# Patient Record
Sex: Female | Born: 1975 | Race: White | Hispanic: No | Marital: Married | Smoking: Former smoker
Health system: Southern US, Community
[De-identification: ages and names within clinical notes are randomized; demographics above are authoritative.]

## PROBLEM LIST (undated history)

## (undated) DIAGNOSIS — F32A Depression, unspecified: Secondary | ICD-10-CM

## (undated) DIAGNOSIS — J189 Pneumonia, unspecified organism: Secondary | ICD-10-CM

## (undated) DIAGNOSIS — G43909 Migraine, unspecified, not intractable, without status migrainosus: Secondary | ICD-10-CM

## (undated) DIAGNOSIS — M19049 Primary osteoarthritis, unspecified hand: Secondary | ICD-10-CM

## (undated) DIAGNOSIS — S61052A Open bite of left thumb without damage to nail, initial encounter: Secondary | ICD-10-CM

## (undated) DIAGNOSIS — M797 Fibromyalgia: Secondary | ICD-10-CM

## (undated) DIAGNOSIS — F329 Major depressive disorder, single episode, unspecified: Secondary | ICD-10-CM

## (undated) DIAGNOSIS — N200 Calculus of kidney: Secondary | ICD-10-CM

## (undated) DIAGNOSIS — K219 Gastro-esophageal reflux disease without esophagitis: Secondary | ICD-10-CM

## (undated) DIAGNOSIS — Z8489 Family history of other specified conditions: Secondary | ICD-10-CM

## (undated) DIAGNOSIS — W540XXA Bitten by dog, initial encounter: Secondary | ICD-10-CM

---
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
## 2013-02-11 ENCOUNTER — Encounter (HOSPITAL_COMMUNITY): Payer: Self-pay | Admitting: Certified Registered Nurse Anesthetist

## 2013-02-11 ENCOUNTER — Encounter (HOSPITAL_COMMUNITY): Payer: Self-pay | Admitting: Internal Medicine

## 2013-02-11 ENCOUNTER — Inpatient Hospital Stay (HOSPITAL_COMMUNITY)
Admission: AD | Admit: 2013-02-11 | Discharge: 2013-02-12 | DRG: 572 | Disposition: A | Discharge status: Home / Self Care | Payer: Worker's Compensation | Source: Other Acute Inpatient Hospital | Attending: Orthopedic Surgery | Admitting: Orthopedic Surgery

## 2013-02-11 ENCOUNTER — Inpatient Hospital Stay (HOSPITAL_COMMUNITY): Payer: Worker's Compensation | Admitting: Certified Registered Nurse Anesthetist

## 2013-02-11 ENCOUNTER — Encounter (HOSPITAL_COMMUNITY)
Admission: AD | Disposition: A | Discharge status: Home / Self Care | Payer: Self-pay | Source: Other Acute Inpatient Hospital | Attending: Internal Medicine

## 2013-02-11 DIAGNOSIS — IMO0001 Reserved for inherently not codable concepts without codable children: Secondary | ICD-10-CM | POA: Diagnosis present

## 2013-02-11 DIAGNOSIS — K219 Gastro-esophageal reflux disease without esophagitis: Secondary | ICD-10-CM | POA: Diagnosis present

## 2013-02-11 DIAGNOSIS — S61209A Unspecified open wound of unspecified finger without damage to nail, initial encounter: Secondary | ICD-10-CM | POA: Diagnosis present

## 2013-02-11 DIAGNOSIS — Y990 Civilian activity done for income or pay: Secondary | ICD-10-CM

## 2013-02-11 DIAGNOSIS — M797 Fibromyalgia: Secondary | ICD-10-CM | POA: Diagnosis present

## 2013-02-11 DIAGNOSIS — L03019 Cellulitis of unspecified finger: Principal | ICD-10-CM | POA: Diagnosis present

## 2013-02-11 DIAGNOSIS — L0390 Cellulitis, unspecified: Secondary | ICD-10-CM | POA: Diagnosis present

## 2013-02-11 DIAGNOSIS — Z79899 Other long term (current) drug therapy: Secondary | ICD-10-CM

## 2013-02-11 DIAGNOSIS — F3289 Other specified depressive episodes: Secondary | ICD-10-CM | POA: Diagnosis present

## 2013-02-11 DIAGNOSIS — F329 Major depressive disorder, single episode, unspecified: Secondary | ICD-10-CM | POA: Diagnosis present

## 2013-02-11 DIAGNOSIS — L02519 Cutaneous abscess of unspecified hand: Principal | ICD-10-CM | POA: Diagnosis present

## 2013-02-11 DIAGNOSIS — L0291 Cutaneous abscess, unspecified: Secondary | ICD-10-CM

## 2013-02-11 DIAGNOSIS — W540XXA Bitten by dog, initial encounter: Secondary | ICD-10-CM | POA: Diagnosis present

## 2013-02-11 HISTORY — PX: INCISION AND DRAINAGE OF WOUND: SHX1803

## 2013-02-11 HISTORY — DX: Family history of other specified conditions: Z84.89

## 2013-02-11 HISTORY — DX: Depression, unspecified: F32.A

## 2013-02-11 HISTORY — DX: Bitten by dog, initial encounter: W54.0XXA

## 2013-02-11 HISTORY — DX: Gastro-esophageal reflux disease without esophagitis: K21.9

## 2013-02-11 HISTORY — DX: Primary osteoarthritis, unspecified hand: M19.049

## 2013-02-11 HISTORY — PX: I & D EXTREMITY: SHX5045

## 2013-02-11 HISTORY — PX: FINGER NAIL SURGERY: SHX717

## 2013-02-11 HISTORY — DX: Pneumonia, unspecified organism: J18.9

## 2013-02-11 HISTORY — DX: Major depressive disorder, single episode, unspecified: F32.9

## 2013-02-11 HISTORY — DX: Open bite of left thumb without damage to nail, initial encounter: S61.052A

## 2013-02-11 HISTORY — DX: Fibromyalgia: M79.7

## 2013-02-11 HISTORY — DX: Migraine, unspecified, not intractable, without status migrainosus: G43.909

## 2013-02-11 HISTORY — DX: Calculus of kidney: N20.0

## 2013-02-11 SURGERY — IRRIGATION AND DEBRIDEMENT EXTREMITY
Anesthesia: General | Site: Thumb | Laterality: Left | Wound class: Dirty or Infected

## 2013-02-11 MED ORDER — CYCLOBENZAPRINE HCL 10 MG PO TABS
10.0000 mg | ORAL_TABLET | Freq: Every day | ORAL | Status: DC
  Filled 2013-02-11: qty 1

## 2013-02-11 MED ORDER — BUPROPION HCL ER (SR) 150 MG PO TB12
450.0000 mg | ORAL_TABLET | Freq: Every morning | ORAL | Status: DC
Start: 2013-02-12 — End: 2013-02-12
  Administered 2013-02-12: 450 mg via ORAL
  Filled 2013-02-11: qty 3

## 2013-02-11 MED ORDER — BUPIVACAINE-EPINEPHRINE 0.5% -1:200000 IJ SOLN
INTRAMUSCULAR | Status: DC | PRN
  Administered 2013-02-11: 5 mL

## 2013-02-11 MED ORDER — ONDANSETRON HCL 4 MG/2ML IJ SOLN: 4.0000 mg | Freq: Four times a day (QID) | INTRAMUSCULAR | Status: DC | PRN

## 2013-02-11 MED ORDER — PROMETHAZINE HCL 25 MG/ML IJ SOLN: 6.2500 mg | INTRAMUSCULAR | Status: DC | PRN

## 2013-02-11 MED ORDER — ONDANSETRON HCL 4 MG/2ML IJ SOLN
INTRAMUSCULAR | Status: DC | PRN
  Administered 2013-02-11: 4 mg via INTRAVENOUS

## 2013-02-11 MED ORDER — SODIUM CHLORIDE 0.9 % IR SOLN
Status: DC | PRN
  Administered 2013-02-11: 1

## 2013-02-11 MED ORDER — HYDROMORPHONE HCL PF 1 MG/ML IJ SOLN: 0.2500 mg | INTRAMUSCULAR | Status: DC | PRN

## 2013-02-11 MED ORDER — PANTOPRAZOLE SODIUM 40 MG PO TBEC
40.0000 mg | DELAYED_RELEASE_TABLET | Freq: Every day | ORAL | Status: DC
  Administered 2013-02-11 – 2013-02-12 (×2): 40 mg via ORAL
  Filled 2013-02-11 (×2): qty 1

## 2013-02-11 MED ORDER — SODIUM CHLORIDE 0.9 % IV SOLN
INTRAVENOUS | Status: DC
  Administered 2013-02-11: 20:00:00 via INTRAVENOUS

## 2013-02-11 MED ORDER — OXYCODONE HCL 5 MG/5ML PO SOLN: 5.0000 mg | Freq: Once | ORAL | Status: AC | PRN

## 2013-02-11 MED ORDER — SUCCINYLCHOLINE CHLORIDE 20 MG/ML IJ SOLN
INTRAMUSCULAR | Status: DC | PRN
  Administered 2013-02-11: 100 mg via INTRAVENOUS

## 2013-02-11 MED ORDER — VITAMIN C 500 MG PO TABS
1000.0000 mg | ORAL_TABLET | Freq: Two times a day (BID) | ORAL | Status: DC
  Administered 2013-02-12: 1000 mg via ORAL
  Filled 2013-02-11 (×2): qty 2

## 2013-02-11 MED ORDER — NAPROXEN 500 MG PO TABS
500.0000 mg | ORAL_TABLET | Freq: Two times a day (BID) | ORAL | Status: DC
  Administered 2013-02-12: 500 mg via ORAL
  Filled 2013-02-11 (×4): qty 1

## 2013-02-11 MED ORDER — SODIUM CHLORIDE 0.9 % IV SOLN
3.0000 g | Freq: Once | INTRAVENOUS | Status: AC
  Administered 2013-02-12: 3 g via INTRAVENOUS
  Filled 2013-02-11: qty 3

## 2013-02-11 MED ORDER — SODIUM CHLORIDE 0.9 % IV SOLN
INTRAVENOUS | Status: DC
  Administered 2013-02-12: 20 mL/h via INTRAVENOUS

## 2013-02-11 MED ORDER — PROPOFOL 10 MG/ML IV BOLUS
INTRAVENOUS | Status: DC | PRN
  Administered 2013-02-11: 200 mg via INTRAVENOUS

## 2013-02-11 MED ORDER — MONTELUKAST SODIUM 10 MG PO TABS
10.0000 mg | ORAL_TABLET | Freq: Every day | ORAL | Status: DC
  Filled 2013-02-11: qty 1

## 2013-02-11 MED ORDER — METHOCARBAMOL 500 MG PO TABS
500.0000 mg | ORAL_TABLET | Freq: Three times a day (TID) | ORAL | Status: DC | PRN
  Administered 2013-02-11 – 2013-02-12 (×2): 500 mg via ORAL
  Filled 2013-02-11 (×2): qty 1

## 2013-02-11 MED ORDER — SODIUM CHLORIDE 0.9 % IV SOLN
3.0000 g | Freq: Three times a day (TID) | INTRAVENOUS | Status: DC
  Administered 2013-02-12 (×2): 3 g via INTRAVENOUS
  Filled 2013-02-11 (×4): qty 3

## 2013-02-11 MED ORDER — SODIUM CHLORIDE 0.9 % IV SOLN
INTRAVENOUS | Status: DC | PRN
  Administered 2013-02-11: 21:00:00 via INTRAVENOUS

## 2013-02-11 MED ORDER — SERTRALINE HCL 100 MG PO TABS
200.0000 mg | ORAL_TABLET | Freq: Every evening | ORAL | Status: DC
  Administered 2013-02-11: 200 mg via ORAL
  Filled 2013-02-11: qty 2
  Filled 2013-02-11: qty 1

## 2013-02-11 MED ORDER — ACETAMINOPHEN 325 MG PO TABS: 650.0000 mg | ORAL_TABLET | Freq: Four times a day (QID) | ORAL | Status: DC | PRN

## 2013-02-11 MED ORDER — HYDROMORPHONE HCL PF 1 MG/ML IJ SOLN
1.0000 mg | INTRAMUSCULAR | Status: DC | PRN
  Administered 2013-02-11 – 2013-02-12 (×3): 1 mg via INTRAVENOUS
  Filled 2013-02-11 (×4): qty 1

## 2013-02-11 MED ORDER — OXYCODONE HCL 5 MG PO TABS: 5.0000 mg | ORAL_TABLET | Freq: Once | ORAL | Status: AC | PRN

## 2013-02-11 MED ORDER — BUPIVACAINE-EPINEPHRINE (PF) 0.5% -1:200000 IJ SOLN
INTRAMUSCULAR | Status: AC
  Filled 2013-02-11: qty 10

## 2013-02-11 MED ORDER — LORATADINE 10 MG PO TABS: 10.0000 mg | ORAL_TABLET | Freq: Every day | ORAL | Status: DC

## 2013-02-11 MED ORDER — FENTANYL CITRATE 0.05 MG/ML IJ SOLN
INTRAMUSCULAR | Status: DC | PRN
  Administered 2013-02-11 (×3): 50 ug via INTRAVENOUS

## 2013-02-11 SURGICAL SUPPLY — 28 items
BANDAGE CONFORM 2  STR LF (GAUZE/BANDAGES/DRESSINGS) ×2 IMPLANT
BNDG COHESIVE 1X5 TAN STRL LF (GAUZE/BANDAGES/DRESSINGS) ×2 IMPLANT
CHLORAPREP W/TINT 26ML (MISCELLANEOUS) ×2 IMPLANT
CLOTH BEACON ORANGE TIMEOUT ST (SAFETY) ×2 IMPLANT
COVER SURGICAL LIGHT HANDLE (MISCELLANEOUS) ×2 IMPLANT
CUFF TOURNIQUET SINGLE 18IN (TOURNIQUET CUFF) ×2 IMPLANT
DRAPE SURG 17X23 STRL (DRAPES) ×2 IMPLANT
GAUZE XEROFORM 1X8 LF (GAUZE/BANDAGES/DRESSINGS) ×2 IMPLANT
GLOVE BIO SURGEON STRL SZ7.5 (GLOVE) ×2 IMPLANT
GLOVE BIOGEL PI IND STRL 8 (GLOVE) ×1 IMPLANT
GLOVE BIOGEL PI INDICATOR 8 (GLOVE) ×1
GOWN PREVENTION PLUS XXLARGE (GOWN DISPOSABLE) ×2 IMPLANT
GOWN STRL NON-REIN LRG LVL3 (GOWN DISPOSABLE) ×2 IMPLANT
KIT BASIN OR (CUSTOM PROCEDURE TRAY) ×2 IMPLANT
KIT ROOM TURNOVER OR (KITS) ×2 IMPLANT
NEEDLE HYPO 25GX1X1/2 BEV (NEEDLE) ×2 IMPLANT
NS IRRIG 1000ML POUR BTL (IV SOLUTION) ×2 IMPLANT
PACK ORTHO EXTREMITY (CUSTOM PROCEDURE TRAY) ×2 IMPLANT
PAD ARMBOARD 7.5X6 YLW CONV (MISCELLANEOUS) ×4 IMPLANT
SPONGE GAUZE 4X4 12PLY (GAUZE/BANDAGES/DRESSINGS) ×2 IMPLANT
SWAB COLLECTION DEVICE MRSA (MISCELLANEOUS) ×2 IMPLANT
SYR BULB IRRIGATION 50ML (SYRINGE) ×2 IMPLANT
SYR CONTROL 10ML LL (SYRINGE) ×2 IMPLANT
TOWEL OR 17X26 10 PK STRL BLUE (TOWEL DISPOSABLE) ×2 IMPLANT
TUBE ANAEROBIC SPECIMEN COL (MISCELLANEOUS) IMPLANT
TUBE CONNECTING 12X1/4 (SUCTIONS) ×2 IMPLANT
UNDERPAD 30X30 INCONTINENT (UNDERPADS AND DIAPERS) ×2 IMPLANT
YANKAUER SUCT BULB TIP NO VENT (SUCTIONS) ×2 IMPLANT

## 2013-02-11 NOTE — Transfer of Care (Signed)
Immediate Anesthesia Transfer of Care Note  Patient: Jamie Yang  Procedure(s) Performed: Procedure(s): IRRIGATION AND DEBRIDEMENTLeft thumb with nail removal. (Left)  Patient Location: PACU  Anesthesia Type:General  Level of Consciousness: awake, alert  and oriented  Airway & Oxygen Therapy: Patient Spontanous Breathing and Patient connected to nasal cannula oxygen  Post-op Assessment: Report given to PACU RN and Post -op Vital signs reviewed and stable  Post vital signs: Reviewed and stable  Complications: No apparent anesthesia complications

## 2013-02-11 NOTE — Preoperative (Signed)
Beta Blockers   Reason not to administer Beta Blockers:Not Applicable 

## 2013-02-11 NOTE — Op Note (Addendum)
02/11/2013  10:12 PM  PATIENT:  Jamie Yang  37 y.o. female  PRE-OPERATIVE DIAGNOSIS:  left thumb dog bite abscess  POST-OPERATIVE DIAGNOSIS:  Same--confirmed felon & parionychium  PROCEDURE:  Removal of left thumb nail plate, incision and excisional debridement of left thumb skin & subcutaneous tissue  SURGEON: Cliffton Asters. Janee Morn, MD  PHYSICIAN ASSISTANT: None  ANESTHESIA:  general  SPECIMENS:  Swabs to microbiology  DRAINS:   None  PREOPERATIVE INDICATIONS:  Jamie Yang is a  37 y.o. 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    The risks benefits and alternatives were discussed with the patient preoperatively including but not limited to the risks of infection, bleeding, nerve injury, cardiopulmonary complications, the need for revision surgery, among others, and the patient verbalized understanding and consented to proceed.  OPERATIVE IMPLANTS: None  OPERATIVE FINDINGS: Pus in the pulp, wounds that communicated from dorsal to volar. There appeared to be no proximal spread of the gross purulence.  OPERATIVE PROCEDURE:  The patient was escorted to the operative theatre and placed in a supine position.  General anesthesia was administered A surgical "time-out" was performed during which the planned procedure, proposed operative site, and the correct patient identity were compared to the operative consent and agreement confirmed by the circulating nurse according to current facility policy.  Following application of a tourniquet to the operative extremity, the exposed skin was prepped with Chloraprep and draped in the usual sterile fashion.  The limb was exsanguinated with an Esmarch bandage and the tourniquet inflated to approximately higher than systolic BP.  The nail plate was separated from the nail bed using a freer elevator and the nail plate was removed. On the  radial aspect of the nail fold there was a perforation that went deep. On the palmar side the areas of blackened skin were completely excised and the interval between them connected with an incision. The major wound had pus emanating forth which was captured for culture. Many of the septae both radial and ulnar to the longitudinal split were disrupted with spreading dissection. The wounds were copiously irrigated and irrigant to be entered dorsally exiting volarly. There appeared to be no proximal spread of the process. Tourniquet was released and a digital block performed with half percent Marcaine with epinephrine. Hemostasis was obtained with direct pressure, and Xeroform was placed on the nailbed & tucked up into the nail fold. Additionally Xeroform was placed into the palmar wound and repacking and this was left open. A dressing was applied and she was awakened and taken to recovery in stable condition  DISPOSITION: She will return the floor for continued antibiotic management per the hospitalist service and monitoring for improvement sufficient to convert to oral antibiotics and be discharged from hospital.

## 2013-02-11 NOTE — H&P (Signed)
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   Review of Systems: The patient denies anorexia, fever, weight loss,, vision loss, decreased hearing, hoarseness, chest pain, syncope, dyspnea on exertion, peripheral edema, balance deficits, hemoptysis, abdominal pain, melena, hematochezia, severe indigestion/heartburn, hematuria, incontinence, genital sores, muscle weakness, suspicious skin lesions, transient blindness, difficulty walking, depression, unusual weight change, abnormal bleeding, enlarged lymph nodes, angioedema, and breast masses.    Past medical history GERD Fibromyalgia Depression . No past surgical history on file.  Social History:  has no tobacco, alcohol, and drug history on file. 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 Authorizing Provider  acetaminophen (TYLENOL) 500 MG tablet Take 1,000 mg by mouth every 6 (six) hours as needed for pain.   Yes Historical Provider, MD  amoxicillin-clavulanate (AUGMENTIN) 875-125 MG per tablet Take 1 tablet by mouth 2 (two) times daily.   Yes Historical Provider, MD  Ascorbic Acid (VITAMIN C) 1000 MG tablet Take  1,000 mg by mouth 2 (two) times daily.   Yes Historical Provider, MD  buPROPion (WELLBUTRIN SR) 150 MG 12 hr tablet Take 450 mg by mouth every morning.   Yes Historical Provider, MD  butalbital-aspirin-caffeine [facility]) 50-325-40 MG per capsule Take 1-2 capsules by mouth every 6 (six) hours as needed for headache or migraine.   Yes Historical Provider, MD  calcium-vitamin D (OSCAL WITH D) 500-200 MG-UNIT per tablet Take 1 tablet by mouth 2 (two) times daily.   Yes Historical Provider, MD  cetirizine (ZYRTEC) 10 MG tablet Take 10 mg by mouth 2 (two) times daily.   Yes Historical Provider, MD  Cholecalciferol (VITAMIN D3) 5000 UNITS TABS Take 5,000 Units by mouth every morning.   Yes Historical Provider, MD  cyclobenzaprine (FLEXERIL) 10 MG tablet Take 10 mg by mouth at bedtime.   Yes Historical Provider, MD  DHA-EPA-VIT B6-B12-FOLIC ACID PO Take 1 tablet by mouth every morning.   Yes Historical Provider, MD  esomeprazole (NEXIUM) 40 MG capsule Take 40 mg by mouth every evening.   Yes Historical Provider, MD  fexofenadine (ALLEGRA) 180 MG tablet Take 180 mg by mouth every morning.   Yes Historical Provider, MD  fish oil-omega-3 fatty acids 1000 MG capsule Take 2 g by mouth 2 (two) times daily.   Yes Historical Provider, MD  ibuprofen (ADVIL,MOTRIN) 200 MG tablet Take 800 mg by mouth every 6 (six) hours as needed for pain.   Yes Historical Provider, MD  Melatonin 3 MG TABS Take 6 mg by mouth every evening.   Yes Historical Provider, MD  methocarbamol (ROBAXIN) 500 MG tablet Take 500 mg by mouth 3 (three) times daily as needed (spasms).   Yes Historical Provider,  MD  montelukast (SINGULAIR) 10 MG tablet Take 10 mg by mouth at bedtime.   Yes Historical Provider, MD  omeprazole (PRILOSEC) 20 MG capsule Take 20 mg by mouth daily.   Yes Historical Provider, MD  oxyCODONE-acetaminophen (PERCOCET) 10-325 MG per tablet Take 1 tablet by mouth every 4 (four) hours as needed for pain.   Yes Historical Provider,  MD  Prenatal Vit-Fe Fumarate-FA (PRENATAL MULTIVITAMIN) TABS Take 1 tablet by mouth every evening.   Yes Historical Provider, MD  Probiotic Product (ALIGN) 4 MG CAPS Take 1 capsule by mouth every evening.   Yes Historical Provider, MD  sertraline (ZOLOFT) 100 MG tablet Take 200 mg by mouth every evening.   Yes Historical Provider, MD  SUMAtriptan-naproxen (TREXIMET) 85-500 MG per tablet Take 1 tablet by mouth every 2 (two) hours as needed for migraine (no more than 2 tablets in a 24 hour period).   Yes Historical Provider, MD   Physical Exam: Filed Vitals:   02/11/13 1842  BP: 130/77  Pulse: 90  Temp: 98.7 F (37.1 C)  TempSrc: Oral  Resp: 15  SpO2: 100%     General:  AAOx3, no distress  HEENT: PERRLA, EOMI  Cardiovascular: S1S2/RRR  Respiratory: CTAB  Abdomen: soft, Nt, BS present  Skin: no rashes  Musculoskeletal:RUE with swelling, redness, tenderness and limited range of movement of thumb, 2 puncture wounds on volar aspect of thumb  Psychiatric: appropriate mood and affect  Neurologic: non focal  Labs on Admission:  Basic Metabolic Panel: Bmet at [facility] nomal CBC with WBC of 13K and rest normal  Liver Function Tests: No results found for this basename: AST, ALT, ALKPHOS, BILITOT, PROT, ALBUMIN,  in the last 168 hours No results found for this basename: LIPASE, AMYLASE,  in the last 168 hours No results found for this basename: AMMONIA,  in the last 168 hours CBC: No results found for this basename: WBC, NEUTROABS, HGB, HCT, MCV, PLT,  in the last 168 hours Cardiac Enzymes: No results found for this basename: CKTOTAL, CKMB, CKMBINDEX, TROPONINI,  in the last 168 hours  BNP (last 3 results) No results found for this basename: PROBNP,  in the last 8760 hours CBG: No results found for this basename: GLUCAP,  in the last 168 hours  Radiological Exams on Admission: No results found. Assessment/Plan Active Problems:   Cellulitis   Fibromyalgia   GERD  (gastroesophageal reflux disease)   1. Cellulitis of Left Thumb, concern for Flexor Tendinitis -start IV Unasyn -Hand Surgery Dr.Thompson consulted -IVF -Keep Finger elevated  2. GERD -PPI  3. Depression -continue wellbutrin and SSRIs  4. Fibromyagia: -continue home meds  DVT proph: SCDs  Code Status: FULL Family Communication: none at bedside Disposition Plan: inpatient  Time spent:  [facility] Pager [phone]  If 7PM-7AM, please contact night-coverage [email or web address] Password [email or web address] 02/11/2013, 8:15 PM

## 2013-02-11 NOTE — Progress Notes (Signed)
ANTIBIOTIC CONSULT NOTE - INITIAL  Pharmacy Consult for Unasyn Indication: Dog bite on thumb  No Known Allergies  Patient Measurements: Height: 5\' 3"  (160 cm) Weight: 217 lb (98.431 kg) IBW/kg (Calculated) : 52.4 Adjusted Body Weight:   Vital Signs: Temp: 99.2 F (37.3 C) (06/09 2200) Temp src: Oral (06/09 2042) BP: 137/82 mmHg (06/09 2215) Pulse Rate: 93 (06/09 2215) Intake/Output from previous day:   Intake/Output from this shift: Total I/O In: 600 [I.V.:600] Out: 5 [Blood:5]  Labs: No results found for this basename: WBC, HGB, PLT, LABCREA, CREATININE,  in the last 72 hours CrCl is unknown because no creatinine reading has been taken. No results found for this basename: VANCOTROUGH, VANCOPEAK, VANCORANDOM, GENTTROUGH, GENTPEAK, GENTRANDOM, TOBRATROUGH, TOBRAPEAK, TOBRARND, AMIKACINPEAK, AMIKACINTROU, AMIKACIN,  in the last 72 hours   Microbiology: No results found for this or any previous visit (from the past 720 hour(s)).  Medical History: Past Medical History  Diagnosis Date  . GERD (gastroesophageal reflux disease)   . Fibromyalgia   . Depression     Medications:  Scheduled:  . [MAR HOLD] ampicillin-sulbactam (UNASYN) IV  3 g Intravenous Once  . [START ON 02/12/2013] ampicillin-sulbactam (UNASYN) IV  3 g Intravenous Q8H  . [facility] HOLD] buPROPion  450 mg Oral q morning - 10a  . [facility] HOLD] cyclobenzaprine  10 mg Oral QHS  . [START ON 02/12/2013] loratadine  10 mg Oral Daily  . [MAR HOLD] montelukast  10 mg Oral QHS  . [MAR HOLD] pantoprazole  40 mg Oral Q1200  . 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 Her PCP started her on Augmentin but the wound got progressively swollen, red and tender. She was referred from [facility] ED to [facility] for surgery and treatment.  Goal of Therapy:  Resolution of infection in thumb.  Plan:  Unasyn 3 Gm IV q8h.  Eugene Garnet 02/11/2013,10:26 PM

## 2013-02-11 NOTE — Anesthesia Procedure Notes (Signed)
Procedure Name: Intubation Date/Time: 02/11/2013 9:23 PM Performed by: Julianne Rice Z Pre-anesthesia Checklist: Patient identified, Timeout performed, Emergency Drugs available, Suction available and Patient being monitored Patient Re-evaluated:Patient Re-evaluated prior to inductionOxygen Delivery Method: Circle system utilized Preoxygenation: Pre-oxygenation with 100% oxygen Intubation Type: IV induction, Cricoid Pressure applied and Rapid sequence Laryngoscope Size: Mac and 3 Grade View: Grade I Tube type: Oral Tube size: 7.5 mm Number of attempts: 1 Airway Equipment and Method: Stylet and LTA kit utilized Placement Confirmation: ETT inserted through vocal cords under direct vision and breath sounds checked- equal and bilateral Secured at: 22 cm Tube secured with: Tape Dental Injury: Teeth and Oropharynx as per pre-operative assessment

## 2013-02-11 NOTE — Consult Note (Signed)
ORTHOPAEDIC CONSULTATION  REQUESTING PHYSICIAN: Zannie Cove, MD  Chief Complaint: Left thumb infection  HPI: Jamie Yang is a 37 y.o. female who complains of  pain and swelling the left thumb. The patient works at a veterinarian's office. She reports that on Friday she was bitten by a dog on the left thumb. On the canines penetrated on the dorsal aspect of the thumb adjacent to the nail radially, another penetrated volarly into the pulp and then incisor also penetrated into the pulp. He became much more swollen and painful overnight and on Saturday she was placed on Augmentin at an urgent care. This failed to significantly improve and she was evaluated by her primary physician today who recommended that she go to the emergency room at the hospital in [place].  She was also given a dose of Rocephin at that time. By the patient's report, she was evaluated by an orthopedic surgeon at the hospital who relayed his concern about the possibility that this was a developing flexor sheath infection.  The patient reports to me that she was told that she would likely need surgery, but that that surgeon was presently too busy. She was transferred for IV antibiotics and further evaluation to [facility].  Past Medical History  Diagnosis Date  . GERD (gastroesophageal reflux disease)   . Fibromyalgia   . Depression    No past surgical history on file. History   Social History  . Marital Status: Married    Spouse Name: N/A    Number of Children: N/A  . Years of Education: N/A   Social History Main Topics  . Smoking status: Not on file  . Smokeless tobacco: Not on file  . Alcohol Use: Not on file  . Drug Use: Not on file  . Sexually Active: Not on file   Other Topics Concern  . Not on file   Social History Narrative  . No narrative on file   No family history on file. No Known Allergies Prior to Admission medications   Medication Sig Start Date End Date Taking? Authorizing Provider   acetaminophen (TYLENOL) 500 MG tablet Take 1,000 mg by mouth every 6 (six) hours as needed for pain.   Yes Historical Provider, MD  amoxicillin-clavulanate (AUGMENTIN) 875-125 MG per tablet Take 1 tablet by mouth 2 (two) times daily.   Yes Historical Provider, MD  Ascorbic Acid (VITAMIN C) 1000 MG tablet Take 1,000 mg by mouth 2 (two) times daily.   Yes Historical Provider, MD  buPROPion (WELLBUTRIN SR) 150 MG 12 hr tablet Take 450 mg by mouth every morning.   Yes Historical Provider, MD  butalbital-aspirin-caffeine [facility]) 50-325-40 MG per capsule Take 1-2 capsules by mouth every 6 (six) hours as needed for headache or migraine.   Yes Historical Provider, MD  calcium-vitamin D (OSCAL WITH D) 500-200 MG-UNIT per tablet Take 1 tablet by mouth 2 (two) times daily.   Yes Historical Provider, MD  cetirizine (ZYRTEC) 10 MG tablet Take 10 mg by mouth 2 (two) times daily.   Yes Historical Provider, MD  Cholecalciferol (VITAMIN D3) 5000 UNITS TABS Take 5,000 Units by mouth every morning.   Yes Historical Provider, MD  cyclobenzaprine (FLEXERIL) 10 MG tablet Take 10 mg by mouth at bedtime.   Yes Historical Provider, MD  DHA-EPA-VIT B6-B12-FOLIC ACID PO Take 1 tablet by mouth every morning.   Yes Historical Provider, MD  esomeprazole (NEXIUM) 40 MG capsule Take 40 mg by mouth every evening.   Yes Historical Provider, MD  fexofenadine (ALLEGRA) 180 MG tablet Take 180 mg by mouth every morning.   Yes Historical Provider, MD  fish oil-omega-3 fatty acids 1000 MG capsule Take 2 g by mouth 2 (two) times daily.   Yes Historical Provider, MD  ibuprofen (ADVIL,MOTRIN) 200 MG tablet Take 800 mg by mouth every 6 (six) hours as needed for pain.   Yes Historical Provider, MD  Melatonin 3 MG TABS Take 6 mg by mouth every evening.   Yes Historical Provider, MD  methocarbamol (ROBAXIN) 500 MG tablet Take 500 mg by mouth 3 (three) times daily as needed (spasms).   Yes Historical Provider, MD  montelukast (SINGULAIR) 10  MG tablet Take 10 mg by mouth at bedtime.   Yes Historical Provider, MD  omeprazole (PRILOSEC) 20 MG capsule Take 20 mg by mouth daily.   Yes Historical Provider, MD  oxyCODONE-acetaminophen (PERCOCET) 10-325 MG per tablet Take 1 tablet by mouth every 4 (four) hours as needed for pain.   Yes Historical Provider, MD  Prenatal Vit-Fe Fumarate-FA (PRENATAL MULTIVITAMIN) TABS Take 1 tablet by mouth every evening.   Yes Historical Provider, MD  Probiotic Product (ALIGN) 4 MG CAPS Take 1 capsule by mouth every evening.   Yes Historical Provider, MD  sertraline (ZOLOFT) 100 MG tablet Take 200 mg by mouth every evening.   Yes Historical Provider, MD  SUMAtriptan-naproxen (TREXIMET) 85-500 MG per tablet Take 1 tablet by mouth every 2 (two) hours as needed for migraine (no more than 2 tablets in a 24 hour period).   Yes Historical Provider, MD   No results found.  Positive ROS: All other systems have been reviewed and were otherwise negative with the exception of those mentioned in the HPI and as above.  Physical Exam: Vitals: Refer to EMR. Constitutional:  WD, WN, NAD HEENT:  NCAT, EOMI Neuro/Psych:  Alert & oriented to person, place, and time; appropriate mood & affect Lymphatic: No generalized UE edema or lymphadenopathy Extremities / MSK:  The extremities are normal with respect to appearance, ranges of motion, joint stability, muscle strength/tone, sensation, & perfusion except as otherwise noted:   The left thumb is swollen, but this is largely confined to the IP crease and distal to it. The pulp is fairly tight, and the areas where the 2 teeth penetrated the pulp are dark with a very light ring around it as if there is some intradermal pus.  On the dorsal side adjacent to the radial aspect of the midportion of the nail there is also a wound and there is a drop of serous drainage from it. Further at the base of the nail fold there appears to be a pocket of pus that has previously drained and is not  presently enlarged. She can extend the thumb to neutral without significant pain, she can extend all the other digits into hyperextension without significant pain there is minimal pain with palpation along the flexor tendon sheath proximal to the IP joint. There appears to be no significant increased pain with flexion and extension of the IP joint itself.  Assessment: Left thumb felon and paronychium  Plan: I discussed these findings with the patient and recommended a small incision and drainage, with debridement as necessary. If she were still in the emergency room, perhaps this could be a bedside procedure under digital block. However due to the hand stability for irrigation and writing and visualization, we will proceed to the operating room for removal of the nail, exploration and incision and drainage as necessary. Goals,  risks and options were reviewed and the patient consented to proceed. She will likely remain hospitalized for additional IV antibiotics and require conversion to oral antibiotics with outpatient medical monitoring for resolution.  Cliffton Asters Janee Morn, MD     Mobile [phone] Orthopaedic & Hand Surgery [facility] [address] [place], [place]  [address] [phone]

## 2013-02-11 NOTE — Anesthesia Postprocedure Evaluation (Signed)
Anesthesia Post Note  Patient: Jamie Yang  Procedure(s) Performed: Procedure(s) (LRB): IRRIGATION AND DEBRIDEMENTLeft thumb with nail removal. (Left)  Anesthesia type: general  Patient location: PACU  Post pain: Pain level controlled  Post assessment: Patient's Cardiovascular Status Stable  Last Vitals:  Filed Vitals:   02/11/13 2230  BP: 130/88  Pulse: 96  Temp: 36.9 C  Resp: 20    Post vital signs: Reviewed and stable  Level of consciousness: sedated  Complications: No apparent anesthesia complications

## 2013-02-11 NOTE — Anesthesia Preprocedure Evaluation (Addendum)
Anesthesia Evaluation  Patient identified by MRN, date of birth, ID band Patient awake    Reviewed: Allergy & Precautions, H&P , NPO status , Patient's Chart, lab work & pertinent test results  History of Anesthesia Complications Negative for: history of anesthetic complications  Airway Mallampati: II      Dental  (+) Teeth Intact and Dental Advisory Given   Pulmonary neg pulmonary ROS,    Pulmonary exam normal       Cardiovascular negative cardio ROS      Neuro/Psych PSYCHIATRIC DISORDERS Depression negative neurological ROS     GI/Hepatic negative GI ROS, Neg liver ROS, GERD-  Medicated and Poorly Controlled,  Endo/Other  negative endocrine ROS  Renal/GU negative Renal ROS  negative genitourinary   Musculoskeletal  (+) Fibromyalgia -  Abdominal   Peds  Hematology negative hematology ROS (+)   Anesthesia Other Findings   Reproductive/Obstetrics negative OB ROS                         Anesthesia Physical Anesthesia Plan  ASA: II and emergent  Anesthesia Plan: General   Post-op Pain Management:    Induction: Intravenous, Rapid sequence and Cricoid pressure planned  Airway Management Planned: Oral ETT  Additional Equipment:   Intra-op Plan:   Post-operative Plan: Extubation in OR  Informed Consent: I have reviewed the patients History and Physical, chart, labs and discussed the procedure including the risks, benefits and alternatives for the proposed anesthesia with the patient or authorized representative who has indicated his/her understanding and acceptance.   Dental advisory given  Plan Discussed with: Anesthesiologist, CRNA and Surgeon  Anesthesia Plan Comments:        Anesthesia Quick Evaluation

## 2013-02-12 ENCOUNTER — Encounter (HOSPITAL_COMMUNITY): Payer: Self-pay | Admitting: Orthopedic Surgery

## 2013-02-12 LAB — BASIC METABOLIC PANEL
Calcium: 8.8 mg/dL (ref 8.4–10.5)
Creatinine, Ser: 0.75 mg/dL (ref 0.50–1.10)
GFR calc Af Amer: >90 mL/min (ref >90)
Sodium: 137 mEq/L (ref 135–145)

## 2013-02-12 LAB — CBC
MCH: 29.8 pg (ref 26.0–34.0)
MCV: 86.4 fL (ref 78.0–100.0)
Platelets: 208 10*3/uL (ref 150–400)
RDW: 12.9 % (ref 11.5–15.5)
WBC: 9.2 10*3/uL (ref 4.0–10.5)

## 2013-02-12 MED ORDER — AMOXICILLIN-POT CLAVULANATE 875-125 MG PO TABS: 1.0000 | ORAL_TABLET | Freq: Two times a day (BID) | ORAL | Status: DC

## 2013-02-12 MED ORDER — CETIRIZINE HCL 10 MG PO TABS
10.0000 mg | ORAL_TABLET | Freq: Every day | ORAL | Status: DC
  Administered 2013-02-12: 10 mg via ORAL
  Filled 2013-02-12: qty 1

## 2013-02-12 MED ORDER — HYDROMORPHONE HCL PF 1 MG/ML IJ SOLN
1.0000 mg | INTRAMUSCULAR | Status: AC
  Administered 2013-02-12: 1 mg via INTRAVENOUS

## 2013-02-12 MED ORDER — NON FORMULARY: 10.0000 mg | Freq: Every day | Status: DC

## 2013-02-12 MED ORDER — OXYCODONE-ACETAMINOPHEN 5-325 MG PO TABS
1.0000 | ORAL_TABLET | Freq: Four times a day (QID) | ORAL | Status: DC | PRN
  Administered 2013-02-12 (×2): 1 via ORAL
  Filled 2013-02-12 (×2): qty 1

## 2013-02-12 MED ORDER — NAPROXEN 500 MG PO TABS: 500.0000 mg | ORAL_TABLET | Freq: Two times a day (BID) | ORAL | Status: AC

## 2013-02-12 NOTE — Progress Notes (Signed)
Pt was given discharge instructions and follow up appointment information. Pt and pt's mother present for teaching. Pt and mother voiced understanding of teaching. Pt will be discharged via wheelchair when she is dressed by this Clinical research associate. Pt reports feeling better and pain being managed.

## 2013-02-12 NOTE — Progress Notes (Signed)
Subjective: POD 1 I&D left thumb;  Feels better, less pain in proximal thumb.  AF, VSS   Objective: Vital signs in last 24 hours: Temp:  [98.2 F (36.8 C)-99.2 F (37.3 C)] 98.4 F (36.9 C) (06/10 1408) Pulse Rate:  [84-107] 90 (06/10 1408) Resp:  [13-20] 18 (06/10 1408) BP: (103-137)/(54-92) 128/70 mmHg (06/10 1408) SpO2:  [97 %-100 %] 98 % (06/10 1408) Weight:  [98.431 kg (217 lb)] 98.431 kg (217 lb) (06/09 2042)  Intake/Output from previous day: 06/09 0701 - 06/10 0700 In: 600 [I.V.:600] Out: 5 [Blood:5] Intake/Output this shift: Total I/O In: 240 [P.O.:240] Out: -    Recent Labs  02/12/13 0630  HGB 13.6    Recent Labs  02/12/13 0630  WBC 9.2  RBC 4.57  HCT 39.5  PLT 208    Recent Labs  02/12/13 0630  NA 137  K 4.0  CL 104  CO2 25  BUN 10  CREATININE 0.75  GLUCOSE 121*  CALCIUM 8.8   No results found for this basename: LABPT, INR,  in the last 72 hours  Bandage intact, minimal proximal edema  Thumb held in full extension  Assessment/Plan: OK to d/c today, resume Augmentin to complete 10 days of original supply Pt to d/c dressing tomorrow at home, re-dress daily with bacitracin/light gauze as needed until no need for dressing. Pt to f/u with me in about a week, barring any worsening in the interim.   Jameisha Stofko A. 02/12/2013, 2:46 PM

## 2013-02-12 NOTE — Care Management Note (Signed)
  Page 1 of 1   02/12/2013     10:47:01 AM   CARE MANAGEMENT NOTE 02/12/2013  Patient:  Jamie Yang,Jamie Yang   Account Number:  [id number]  Date Initiated:  02/12/2013  Documentation initiated by:  Ronny Flurry  Subjective/Objective Assessment:     Action/Plan:   Anticipated DC Date:  02/12/2013   Anticipated DC Plan:           Choice offered to / List presented to:  C-1 Patient           Status of service:   Medicare Important Message given?   (If response is "NO", the following Medicare IM given date fields will be blank) Date Medicare IM given:   Date Additional Medicare IM given:    Discharge Disposition:    Per UR Regulation:    If discussed at Long Length of Stay Meetings, dates discussed:    Comments:  02-12-13 Patient listed as unisured. Spoke to patient regarding prescriptions at discharge. Patient states she does have insurance ( just changed companies and does not have card on her) . However, patient states this will be under worker's comp and she will not have a problem getting prescriptions filled at discharge.  Ronny Flurry RN BSN [phone]

## 2013-02-12 NOTE — Discharge Summary (Signed)
Physician Discharge Summary  Patient ID: Jamie Yang MRN: [id number] DOB/AGE: January 01, 1976 37 y.o.  Admit date: 02/11/2013 Discharge date: 2013-02-15  Admission Diagnoses:  Left thumb abscess  Discharge Diagnoses:  Active Problems:   Cellulitis   Fibromyalgia   GERD (gastroesophageal reflux disease)   Past Medical History  Diagnosis Date  . GERD (gastroesophageal reflux disease)   . Fibromyalgia   . Depression   . Dog bite of left thumb   . Family history of anesthesia complication     "parents very sensitive to RX" (2013-02-15)  . Pneumonia 1994  . Migraines     "maybe monthly" (February 15, 2013)  . Osteoarthritis of hand     "both" (02-15-2013)  . Kidney stones     "passed on own" (Feb 15, 2013)    Surgeries: Procedure(s): IRRIGATION AND DEBRIDEMENTLeft thumb with nail removal. on 02/11/2013   Consultants (if any):    Discharged Condition: Improved  Hospital Course: Jamie Yang is an 37 y.o. female who was admitted 02/11/2013 with a diagnosis of <principal problem not specified> and went to the operating room on 02/11/2013 and underwent the above named procedures.    She was given perioperative antibiotics:  Anti-infectives   Start     Dose/Rate Route Frequency Ordered Stop   15-Feb-2013 0600  Ampicillin-Sulbactam (UNASYN) 3 g in sodium chloride 0.9 % 100 mL IVPB     3 g 100 mL/hr over 60 Minutes Intravenous Every 8 hours 02/11/13 2224     02-15-13 0000  amoxicillin-clavulanate (AUGMENTIN) 875-125 MG per tablet     1 tablet Oral 2 times daily 2013-02-15 1500     02/11/13 2000  Ampicillin-Sulbactam (UNASYN) 3 g in sodium chloride 0.9 % 100 mL IVPB     3 g 100 mL/hr over 60 Minutes Intravenous  Once 02/11/13 1958 02/15/13 0136    .  She was given sequential compression devices, early ambulation, for DVT prophylaxis.  She benefited maximally from the hospital stay and there were no complications.    Recent vital signs:  Filed Vitals:   02/15/13 1408  BP: 128/70  Pulse: 90  Temp:  98.4 F (36.9 C)  Resp: 18    Recent laboratory studies:  Lab Results  Component Value Date   HGB 13.6 02-15-2013   Lab Results  Component Value Date   WBC 9.2 02-15-13   PLT 208 02-15-13   No results found for this basename: INR   Lab Results  Component Value Date   NA 137 02/15/2013   K 4.0 15-Feb-2013   CL 104 2013/02/15   CO2 25 02-15-2013   BUN 10 2013-02-15   CREATININE 0.75 February 15, 2013   GLUCOSE 121* 02/15/2013    Discharge Medications:     Medication List    TAKE these medications       acetaminophen 500 MG tablet  Commonly known as:  TYLENOL  Take 1,000 mg by mouth every 6 (six) hours as needed for pain.     ALIGN 4 MG Caps  Take 1 capsule by mouth every evening.     amoxicillin-clavulanate 875-125 MG per tablet  Commonly known as:  AUGMENTIN  Take 1 tablet by mouth 2 (two) times daily.     buPROPion 150 MG 12 hr tablet  Commonly known as:  WELLBUTRIN SR  Take 450 mg by mouth every morning.     butalbital-aspirin-caffeine 50-325-40 MG per capsule  Commonly known as:  FIORINAL  Take 1-2 capsules by mouth every 6 (six) hours as needed for headache or migraine.  calcium-vitamin D 500-200 MG-UNIT per tablet  Commonly known as:  OSCAL WITH D  Take 1 tablet by mouth 2 (two) times daily.     cetirizine 10 MG tablet  Commonly known as:  ZYRTEC  Take 10 mg by mouth 2 (two) times daily.     cyclobenzaprine 10 MG tablet  Commonly known as:  FLEXERIL  Take 10 mg by mouth at bedtime.     DHA-EPA-VIT B6-B12-FOLIC ACID PO  Take 1 tablet by mouth every morning.     esomeprazole 40 MG capsule  Commonly known as:  NEXIUM  Take 40 mg by mouth every evening.     fexofenadine 180 MG tablet  Commonly known as:  ALLEGRA  Take 180 mg by mouth every morning.     fish oil-omega-3 fatty acids 1000 MG capsule  Take 2 g by mouth 2 (two) times daily.     ibuprofen 200 MG tablet  Commonly known as:  ADVIL,MOTRIN  Take 800 mg by mouth every 6 (six) hours as needed  for pain.     Melatonin 3 MG Tabs  Take 6 mg by mouth every evening.     methocarbamol 500 MG tablet  Commonly known as:  ROBAXIN  Take 500 mg by mouth 3 (three) times daily as needed (spasms).     montelukast 10 MG tablet  Commonly known as:  SINGULAIR  Take 10 mg by mouth at bedtime.     naproxen 500 MG tablet  Commonly known as:  NAPROSYN  Take 1 tablet (500 mg total) by mouth 2 (two) times daily with a meal.     omeprazole 20 MG capsule  Commonly known as:  PRILOSEC  Take 20 mg by mouth daily.     oxyCODONE-acetaminophen 10-325 MG per tablet  Commonly known as:  PERCOCET  Take 1 tablet by mouth every 4 (four) hours as needed for pain.     prenatal multivitamin Tabs  Take 1 tablet by mouth every evening.     sertraline 100 MG tablet  Commonly known as:  ZOLOFT  Take 200 mg by mouth every evening.     SUMAtriptan-naproxen 85-500 MG per tablet  Commonly known as:  TREXIMET  Take 1 tablet by mouth every 2 (two) hours as needed for migraine (no more than 2 tablets in a 24 hour period).     vitamin C 1000 MG tablet  Take 1,000 mg by mouth 2 (two) times daily.     Vitamin D3 5000 UNITS Tabs  Take 5,000 Units by mouth every morning.        Diagnostic Studies: No results found.  Disposition: Home with PO antibiotics and dressing changes        Follow-up Information   Schedule an appointment as soon as possible for a visit with Janee Morn, Caydence Koenig A., MD. (approximately 1 week)    Contact information:   [address] [place] [address] [phone]        Signed: Taegan Standage A. 02/12/2013, 3:05 PM

## 2013-02-14 LAB — CULTURE, ROUTINE-ABSCESS

## 2013-02-18 LAB — ANAEROBIC CULTURE

## 2013-02-18 LAB — CULTURE, BLOOD (ROUTINE X 2): Culture: NO GROWTH

## 2013-05-08 ENCOUNTER — Ambulatory Visit: Payer: BC Managed Care – PPO | Admitting: Neurology

## 2016-07-14 ENCOUNTER — Ambulatory Visit: Payer: Self-pay | Admitting: Rheumatology

## 2016-08-04 ENCOUNTER — Ambulatory Visit: Payer: Self-pay | Admitting: Rheumatology

## 2016-08-30 ENCOUNTER — Ambulatory Visit: Payer: Self-pay | Admitting: Rheumatology

## 2016-09-01 DIAGNOSIS — E559 Vitamin D deficiency, unspecified: Secondary | ICD-10-CM | POA: Insufficient documentation

## 2016-09-01 DIAGNOSIS — Z8719 Personal history of other diseases of the digestive system: Secondary | ICD-10-CM | POA: Insufficient documentation

## 2016-09-01 DIAGNOSIS — Z87442 Personal history of urinary calculi: Secondary | ICD-10-CM | POA: Insufficient documentation

## 2016-09-01 DIAGNOSIS — Z8669 Personal history of other diseases of the nervous system and sense organs: Secondary | ICD-10-CM | POA: Insufficient documentation

## 2016-09-01 DIAGNOSIS — R5383 Other fatigue: Secondary | ICD-10-CM | POA: Insufficient documentation

## 2016-09-01 NOTE — Progress Notes (Signed)
Office Visit Note  Patient: Jamie Yang             Date of Birth: Sep 16, 1975           MRN: [id number]             PCP: Yvone Neu, MD Referring: Yvone Neu Visit Date: 09/06/2016 Occupation: @GUAROCC @    Subjective:  No chief complaint on file. Follow-up on fibromyalgia.  History of Present Illness: Jamie Yang is a 40 y.o. female  Last seen 11/30/2015. Patient's fibromyalgia is unchanged since the last visit in March. In fact she is having quite a bit of stress in her life at the moment.  She had a stillborn at [redacted] weeks gestation in September 2017 after fertility specialist involvement. This is caused a great deal of stress in her life.  Patient does not know at this time whether or not she will move forward with another pregnancy. She will call us and let us know. She already is aware to avoid certain medications during pregnancy or to discuss with her gynecologist/obstetrician any medications that may be contraindicated while pregnant.  Robaxin and Flexeril do work well for the patient.  She's having some fatigue lately and has a history of vitamin D deficiency in the past. We will recheck vitamin D levels today in office.   Activities of Daily Living:  Patient reports morning stiffness for 15 minutes.   Patient Denies nocturnal pain.  Difficulty dressing/grooming: Denies Difficulty climbing stairs: Denies Difficulty getting out of chair: Denies Difficulty using hands for taps, buttons, cutlery, and/or writing: Denies   Review of Systems  Constitutional: Positive for fatigue.  HENT: Negative for mouth sores and mouth dryness.   Eyes: Negative for dryness.  Respiratory: Negative for shortness of breath.   Gastrointestinal: Negative for constipation and diarrhea.  Musculoskeletal: Positive for myalgias and myalgias.  Skin: Negative for sensitivity to sunlight.  Psychiatric/Behavioral: Positive for sleep disturbance. Negative for decreased  concentration.    PMFS History:  Patient Active Problem List   Diagnosis Date Noted  . Other fatigue 09/01/2016  . Vitamin D deficiency 09/01/2016  . History of migraine 09/01/2016  . Family history of GERD 09/01/2016  . Family history of fatty liver 09/01/2016  . History of kidney stones 09/01/2016  . History of irritable bowel syndrome 09/01/2016  . Cellulitis 02/11/2013  . Fibromyalgia 02/11/2013  . GERD (gastroesophageal reflux disease) 02/11/2013    Past Medical History:  Diagnosis Date  . Depression   . Dog bite of left thumb   . Family history of anesthesia complication    "parents very sensitive to RX" (03-09-2013)  . Fibromyalgia   . GERD (gastroesophageal reflux disease)   . Kidney stones    "passed on own" (03-09-13)  . Migraines    "maybe monthly" (09-Mar-2013)  . Osteoarthritis of hand    "both" (2013/03/09)  . Pneumonia 1994    History reviewed. No pertinent family history. Past Surgical History:  Procedure Laterality Date  . CESAREAN SECTION    . FINGER NAIL SURGERY  02/11/2013   'took it off to get to I&D" (02/11/2013)  . I&D EXTREMITY Left 02/11/2013   Procedure: IRRIGATION AND DEBRIDEMENTLeft thumb with nail removal.;  Surgeon: Jolyn Nap, MD;  Location: [facility];  Service: Orthopedics;  Laterality: Left;  . INCISION AND DRAINAGE OF WOUND Left 02/11/2013   "thumb; dogbite" (02/11/2013)   Social History   Social History Narrative  . No narrative on file  Objective: Vital Signs: BP 126/81 (BP Location: Left Arm, Patient Position: Sitting, Cuff Size: Normal)   Pulse 90   Resp 14   Ht 5' 2"  (1.575 m)   Wt 231 lb (104.8 kg)   LMP 08/29/2016   BMI 42.25 kg/m    Physical Exam  Constitutional: She is oriented to person, place, and time. She appears well-developed and well-nourished.  HENT:  Head: Normocephalic and atraumatic.  Eyes: EOM are normal. Pupils are equal, round, and reactive to light.  Cardiovascular: Normal rate, regular rhythm and  normal heart sounds.  Exam reveals no gallop and no friction rub.   No murmur heard. Pulmonary/Chest: Effort normal and breath sounds normal. She has no wheezes. She has no rales.  Abdominal: Soft. Bowel sounds are normal. She exhibits no distension. There is no tenderness. There is no guarding. No hernia.  Musculoskeletal: Normal range of motion. She exhibits no edema, tenderness or deformity.  Lymphadenopathy:    She has no cervical adenopathy.  Neurological: She is alert and oriented to person, place, and time. Coordination normal.  Skin: Skin is warm and dry. Capillary refill takes less than 2 seconds. No rash noted.  Psychiatric: She has a normal mood and affect. Her behavior is normal.  Nursing note and vitals reviewed.    Musculoskeletal Exam:  Full range of motion of all joints Grip strength is equal and strong bilaterally Fiber myalgia tender points are all absent  CDAI Exam: No CDAI exam completed.  No synovitis on exam  Investigation: Findings:  In June of 2009, her CBC was normal.  Sed rate was 26.  Comprehensive metabolic panel was normal.  CK, TSH, rheumatoid factor, ANA, serum protein electrophoresis, CCP, HLAB-27 were all within normal limits.  Vitamin D was in the lower limits of normal at 31.      Imaging: No results found.  Speciality Comments: No specialty comments available.    Procedures:  No procedures performed Allergies: Patient has no known allergies.   Assessment / Plan:     Visit Diagnoses: Fibromyalgia  Other fatigue - Plan: CBC with Differential/Platelet, COMPLETE METABOLIC PANEL WITH GFR, VITAMIN D 25 Hydroxy (Vit-D Deficiency, Fractures)  Vitamin D deficiency - Plan: VITAMIN D 25 Hydroxy (Vit-D Deficiency, Fractures)  History of migraine  Family history of GERD  Family history of fatty liver  History of kidney stones  History of irritable bowel syndrome   Plan: #1: Fibromyalgia discomfort is ongoing. Significant stress in family  currently.  #2: Fatigue. History of vitamin D deficiency as well. We'll check vitamin D today in office.  #3: OA of the hands with ongoing hand pain. Discussed strategies to exercise hand muscles to minimize joint wear and tear.  #4: Discussed use of Robaxin, Flexeril, Voltaren gel. Has enough medications at home at this time and will call us when she needs a refill. She also will let us know if she is pregnant at that time and we'll avoid medications that may interfere with her pregnancy. Patient understands and is agreeable.  #5: Trapezius muscle spasms. Discussed use of Voltaren gel to the area, minimize stress, muscle relaxers.  #6: Return to clinic in 6 months  #7: CBC with differential, CMP with GFR, vitamin D 25 OH today in office  Orders: Orders Placed This Encounter  Procedures  . CBC with Differential/Platelet  . COMPLETE METABOLIC PANEL WITH GFR  . VITAMIN D 25 Hydroxy (Vit-D Deficiency, Fractures)   No orders of the defined types were placed in this encounter.  Face-to-face time spent with patient was 30 minutes. 50% of time was spent in counseling and coordination of care.  Follow-Up Instructions: Return in about 6 months (around 03/06/2017) for FMS, fatigue, insomnia, cmc pain, oa hand pain, .   Eliezer Lofts, PA-C

## 2016-09-06 ENCOUNTER — Encounter: Payer: Self-pay | Admitting: Rheumatology

## 2016-09-06 ENCOUNTER — Ambulatory Visit (INDEPENDENT_AMBULATORY_CARE_PROVIDER_SITE_OTHER): Payer: BLUE CROSS/BLUE SHIELD | Admitting: Rheumatology

## 2016-09-06 VITALS — BP 126/81 | HR 90 | Resp 14 | Ht 62.0 in | Wt 231.0 lb

## 2016-09-06 DIAGNOSIS — Z8669 Personal history of other diseases of the nervous system and sense organs: Secondary | ICD-10-CM | POA: Diagnosis not present

## 2016-09-06 DIAGNOSIS — M797 Fibromyalgia: Secondary | ICD-10-CM | POA: Diagnosis not present

## 2016-09-06 DIAGNOSIS — E559 Vitamin D deficiency, unspecified: Secondary | ICD-10-CM | POA: Diagnosis not present

## 2016-09-06 DIAGNOSIS — Z8379 Family history of other diseases of the digestive system: Secondary | ICD-10-CM | POA: Diagnosis not present

## 2016-09-06 DIAGNOSIS — R5383 Other fatigue: Secondary | ICD-10-CM | POA: Diagnosis not present

## 2016-09-06 DIAGNOSIS — Z8719 Personal history of other diseases of the digestive system: Secondary | ICD-10-CM

## 2016-09-06 DIAGNOSIS — Z87442 Personal history of urinary calculi: Secondary | ICD-10-CM

## 2016-09-07 LAB — COMPLETE METABOLIC PANEL WITH GFR
ALK PHOS: 85 U/L (ref 33–115)
ALT: 27 U/L (ref 6–29)
AST: 24 U/L (ref 10–30)
Albumin: 4.2 g/dL (ref 3.6–5.1)
BUN: 8 mg/dL (ref 7–25)
CALCIUM: 9.5 mg/dL (ref 8.6–10.2)
CHLORIDE: 103 mmol/L (ref 98–110)
CO2: 26 mmol/L (ref 20–31)
Creat: 0.88 mg/dL (ref 0.50–1.10)
GFR, EST NON AFRICAN AMERICAN: 82 mL/min (ref >=60)
Glucose, Bld: 85 mg/dL (ref 65–99)
POTASSIUM: 4.2 mmol/L (ref 3.5–5.3)
Sodium: 137 mmol/L (ref 135–146)
Total Bilirubin: 0.3 mg/dL (ref 0.2–1.2)
Total Protein: 7.1 g/dL (ref 6.1–8.1)

## 2016-09-07 LAB — CBC WITH DIFFERENTIAL/PLATELET
Basophils Absolute: 76 cells/uL (ref 0–200)
Basophils Relative: 1 %
Eosinophils Absolute: 228 cells/uL (ref 15–500)
Eosinophils Relative: 3 %
HEMATOCRIT: 43.5 % (ref 35.0–45.0)
Hemoglobin: 14.2 g/dL (ref 11.7–15.5)
Lymphocytes Relative: 41 %
Lymphs Abs: 3116 cells/uL (ref 850–3900)
MCH: 29 pg (ref 27.0–33.0)
MCHC: 32.6 g/dL (ref 32.0–36.0)
MCV: 88.8 fL (ref 80.0–100.0)
MONO ABS: 456 {cells}/uL (ref 200–950)
MPV: 9.8 fL (ref 7.5–12.5)
Monocytes Relative: 6 %
NEUTROS PCT: 49 %
Neutro Abs: 3724 cells/uL (ref 1500–7800)
Platelets: 269 10*3/uL (ref 140–400)
RBC: 4.9 MIL/uL (ref 3.80–5.10)
RDW: 14 % (ref 11.0–15.0)
WBC: 7.6 10*3/uL (ref 3.8–10.8)

## 2016-09-07 LAB — VITAMIN D 25 HYDROXY (VIT D DEFICIENCY, FRACTURES): VIT D 25 HYDROXY: 43 ng/mL (ref 30–100)

## 2016-09-07 NOTE — Progress Notes (Signed)
#  1: CMP with GFR is normal#2: Vitamin D is normal at 43#3: CBC with differential is normal#4: For this message to PCP.#5:12 patient labs are all normal

## 2016-11-20 ENCOUNTER — Other Ambulatory Visit: Payer: Self-pay | Admitting: Rheumatology

## 2016-11-21 NOTE — Telephone Encounter (Signed)
Last Visit: 09/06/16 Next Visit: 03/14/17  Okay to refill Flexeril?

## 2017-03-14 ENCOUNTER — Ambulatory Visit: Payer: BLUE CROSS/BLUE SHIELD | Admitting: Rheumatology

## 2017-06-05 DIAGNOSIS — F419 Anxiety disorder, unspecified: Secondary | ICD-10-CM

## 2017-06-05 DIAGNOSIS — F329 Major depressive disorder, single episode, unspecified: Secondary | ICD-10-CM | POA: Insufficient documentation

## 2017-06-05 NOTE — Progress Notes (Signed)
Office Visit Note  Patient: Jamie Yang             Date of Birth: May 06, 1976           MRN: [id number]             PCP: Maximiano Coss, MD Referring: Maximiano Coss Visit Date: 06/06/2017 Occupation: @    Subjective:  Lower back pain and neck pain.   History of Present Illness: Jamie Yang is a 41 y.o. female with history of fibromyalgia syndrome. She states she's been having increased pain in her lower back. She states she woke up on last Thursday and had difficulty walking due to lower back pain radiating into her bilateral lower extremities. She continues to have problems with her lower back and radiculopathy. She states she's been having neck pain and stiffness in her trapezius area bilaterally. She also gets headaches related to her neck pain.  Activities of Daily Living:  Patient reports morning stiffness for 20 minutes.   Patient Reports nocturnal pain.  Difficulty dressing/grooming: Denies Difficulty climbing stairs: Denies Difficulty getting out of chair: Denies Difficulty using hands for taps, buttons, cutlery, and/or writing: Denies   Review of Systems  Constitutional: Negative.  Negative for fatigue, night sweats, weight gain, weight loss and weakness.  HENT: Negative for mouth sores, trouble swallowing, trouble swallowing, mouth dryness and nose dryness.   Eyes: Positive for dryness. Negative for pain, redness and visual disturbance.  Respiratory: Negative.  Negative for cough, shortness of breath and difficulty breathing.   Cardiovascular: Negative.  Negative for chest pain, palpitations, hypertension, irregular heartbeat and swelling in legs/feet.  Gastrointestinal: Negative.  Negative for blood in stool, constipation and diarrhea.  Endocrine: Negative for increased urination.  Genitourinary: Negative for vaginal dryness.  Musculoskeletal: Positive for arthralgias, joint pain, morning stiffness and muscle tenderness. Negative for joint  swelling, myalgias, muscle weakness and myalgias.  Skin: Negative.  Negative for color change, rash, hair loss, skin tightness, ulcers and sensitivity to sunlight.  Allergic/Immunologic: Negative for susceptible to infections.  Neurological: Positive for headaches. Negative for dizziness, numbness, memory loss and night sweats.  Hematological: Negative for swollen glands.  Psychiatric/Behavioral: Positive for depressed mood. Negative for sleep disturbance. The patient is not nervous/anxious.     PMFS History:  Patient Active Problem List   Diagnosis Date Noted  . Anxiety and depression 06/05/2017  . Other fatigue 09/01/2016  . Vitamin D deficiency 09/01/2016  . History of migraine 09/01/2016  . History of gastroesophageal reflux (GERD) 09/01/2016  . History of fatty infiltration of liver 09/01/2016  . History of kidney stones 09/01/2016  . History of irritable bowel syndrome 09/01/2016  . Cellulitis 02/11/2013  . Fibromyalgia 02/11/2013  . GERD (gastroesophageal reflux disease) 02/11/2013    Past Medical History:  Diagnosis Date  . Depression   . Dog bite of left thumb   . Family history of anesthesia complication    "parents very sensitive to RX" (02-26-2013)  . Fibromyalgia   . GERD (gastroesophageal reflux disease)   . Kidney stones    "passed on own" (2013-02-26)  . Migraines    "maybe monthly" (02-26-2013)  . Osteoarthritis of hand    "both" (02/26/2013)  . Pneumonia 1994    No family history on file. Past Surgical History:  Procedure Laterality Date  . CESAREAN SECTION    . FINGER NAIL SURGERY  02/11/2013   'took it off to get to I&D" (02/11/2013)  . I&D EXTREMITY Left 02/11/2013  Procedure: IRRIGATION AND DEBRIDEMENTLeft thumb with nail removal.;  Surgeon: Jodi Marble, MD;  Location: [facility] OR;  Service: Orthopedics;  Laterality: Left;  . INCISION AND DRAINAGE OF WOUND Left 02/11/2013   "thumb; dogbite" (02/11/2013)   Social History   Social History Narrative  . No  narrative on file     Objective: Vital Signs: BP 123/72 (BP Location: Left Arm, Patient Position: Sitting, Cuff Size: Normal)   Pulse 79   Ht  (1.575 m)   Wt 242 lb (109.8 kg)   BMI 44.26 kg/m    Physical Exam  Constitutional: She is oriented to person, place, and time. She appears well-developed and well-nourished.  HENT:  Head: Normocephalic and atraumatic.  Eyes: Conjunctivae and EOM are normal.  Neck: Normal range of motion.  Cardiovascular: Normal rate, regular rhythm, normal heart sounds and intact distal pulses.   Pulmonary/Chest: Effort normal and breath sounds normal.  Abdominal: Soft. Bowel sounds are normal.  Lymphadenopathy:    She has no cervical adenopathy.  Neurological: She is alert and oriented to person, place, and time.  Skin: Skin is warm and dry. Capillary refill takes less than 2 seconds.  Psychiatric: She has a normal mood and affect. Her behavior is normal.  Nursing note and vitals reviewed.    Musculoskeletal Exam: C-spine good range of motion. She had bilateral trapezius spasm. Shoulder joints elbow joints wrist joints MCPs PIPs DIPs are good range of motion. Hip joints knee joints ankles MTPs PIPs DIPs are good range of motion. She is some discomfort range of motion of her lumbar spine with tenderness in the lower lumbar region. Straight leg raising test was negative.  CDAI Exam: No CDAI exam completed.    Investigation: No additional findings. CBC Latest Ref Rng & Units 09/06/2016 02/12/2013  WBC 3.8 - 10.8 K/uL 7.6 9.2  Hemoglobin 11.7 - 15.5 g/dL 54.0 98.1  Hematocrit 19.1 - 45.0 % 43.5 39.5  Platelets 140 - 400 K/uL 269 208   CMP Latest Ref Rng & Units 09/06/2016 02/12/2013  Glucose 65 - 99 mg/dL 85 478(G)  BUN 7 - 25 mg/dL 8 10  Creatinine 9.56 - 1.10 mg/dL 2.13 0.86  Sodium 578 - 146 mmol/L 137 137  Potassium 3.5 - 5.3 mmol/L 4.2 4.0  Chloride 98 - 110 mmol/L 103 104  CO2 20 - 31 mmol/L 26 25  Calcium 8.6 - 10.2 mg/dL 9.5 8.8  Total  Protein 6.1 - 8.1 g/dL 7.1 -  Total Bilirubin 0.2 - 1.2 mg/dL 0.3 -  Alkaline Phos 33 - 115 U/L 85 -  AST 10 - 30 U/L 24 -  ALT 6 - 29 U/L 27 -  Imaging: No results found.  Speciality Comments: No specialty comments available.    Procedures:  Trigger Point Inj Date/Time: 06/06/2017 9:40 AM Performed by: Pollyann Savoy Authorized by: Pollyann Savoy   Consent Given by:  Patient Site marked: the procedure site was marked   Timeout: prior to procedure the correct patient, procedure, and site was verified   Indications:  Muscle spasm and pain Total # of Trigger Points:  2 Location: neck   Needle Size:  27 G Approach:  Dorsal Medications #1:  0.5 mL lidocaine 1 %; 10 mg triamcinolone acetonide 40 MG/ML Medications #2:  0.5 mL lidocaine 1 %; 10 mg triamcinolone acetonide 40 MG/ML Patient tolerance:  Patient tolerated the procedure well with no immediate complications   Allergies: Patient has no known allergies.   Assessment / Plan:  Visit Diagnoses: Fibromyalgia: She has generalized pain and discomfort with positive tender points.  Other fatigue: She continues to have some fatigue.  Neck pain -she's been having neck pain and muscle spasms. Different treatment options and the side effects were discussed. Bilateral trapezius area injected with lidocaine and Kenalog as described above. Plan: Ambulatory referral to Physical Therapy  Chronic bilateral low back pain with bilateral sciatica -patient complains of increased lower back pain with bilateral lower extremity which will up with the. Plan: XR Lumbar Spine 2-3 Views, x-rays are consistent with mild is spondylosis and disc space narrowing. If her symptoms persist after physical therapy and may consider getting MRI of her lumbar spine. Ambulatory referral to Physical Therapy. Patient will [facility] to [facility]. If her symptoms persist after physical therapy we may have to consider MRI. Patient will notify me.  Obesity  class III: Weight loss diet and exercise was discussed. I believe that will help with her lower back pain as well.  Vitamin D deficiency: She's been taking vitamin D supplement.  Her other medical problems are listed as follows:  History of kidney stones  History of migraine: She relates some of the migraines due to neck pain.  History of irritable bowel syndrome  History of gastroesophageal reflux (GERD)  History of fatty infiltration of liver  Anxiety and depression    Orders: Orders Placed This Encounter  Procedures  . XR Lumbar Spine 2-3 Views  . Ambulatory referral to Physical Therapy   No orders of the defined types were placed in this encounter.     Follow-Up Instructions: Return in about 6 months (around 12/05/2017) for FMS .   Pollyann Savoy, MD  Note - This record has been created using Animal nutritionist.  Chart creation errors have been sought, but may not always  have been located. Such creation errors do not reflect on  the standard of medical care.

## 2017-06-06 ENCOUNTER — Ambulatory Visit (INDEPENDENT_AMBULATORY_CARE_PROVIDER_SITE_OTHER): Payer: BLUE CROSS/BLUE SHIELD | Admitting: Rheumatology

## 2017-06-06 ENCOUNTER — Encounter: Payer: Self-pay | Admitting: Rheumatology

## 2017-06-06 ENCOUNTER — Ambulatory Visit (INDEPENDENT_AMBULATORY_CARE_PROVIDER_SITE_OTHER): Payer: BLUE CROSS/BLUE SHIELD

## 2017-06-06 VITALS — BP 123/72 | HR 79 | Ht 62.0 in | Wt 242.0 lb

## 2017-06-06 DIAGNOSIS — G8929 Other chronic pain: Secondary | ICD-10-CM

## 2017-06-06 DIAGNOSIS — E559 Vitamin D deficiency, unspecified: Secondary | ICD-10-CM

## 2017-06-06 DIAGNOSIS — M5136 Other intervertebral disc degeneration, lumbar region: Secondary | ICD-10-CM

## 2017-06-06 DIAGNOSIS — Z87442 Personal history of urinary calculi: Secondary | ICD-10-CM | POA: Diagnosis not present

## 2017-06-06 DIAGNOSIS — M5442 Lumbago with sciatica, left side: Secondary | ICD-10-CM | POA: Diagnosis not present

## 2017-06-06 DIAGNOSIS — M51369 Other intervertebral disc degeneration, lumbar region without mention of lumbar back pain or lower extremity pain: Secondary | ICD-10-CM

## 2017-06-06 DIAGNOSIS — Z8719 Personal history of other diseases of the digestive system: Secondary | ICD-10-CM

## 2017-06-06 DIAGNOSIS — Z6841 Body Mass Index (BMI) 40.0 and over, adult: Secondary | ICD-10-CM

## 2017-06-06 DIAGNOSIS — F419 Anxiety disorder, unspecified: Secondary | ICD-10-CM | POA: Diagnosis not present

## 2017-06-06 DIAGNOSIS — Z8669 Personal history of other diseases of the nervous system and sense organs: Secondary | ICD-10-CM

## 2017-06-06 DIAGNOSIS — R5383 Other fatigue: Secondary | ICD-10-CM | POA: Diagnosis not present

## 2017-06-06 DIAGNOSIS — M542 Cervicalgia: Secondary | ICD-10-CM | POA: Diagnosis not present

## 2017-06-06 DIAGNOSIS — M5441 Lumbago with sciatica, right side: Secondary | ICD-10-CM | POA: Diagnosis not present

## 2017-06-06 DIAGNOSIS — E66813 Obesity, class 3: Secondary | ICD-10-CM

## 2017-06-06 DIAGNOSIS — F329 Major depressive disorder, single episode, unspecified: Secondary | ICD-10-CM

## 2017-06-06 DIAGNOSIS — M797 Fibromyalgia: Secondary | ICD-10-CM

## 2017-06-06 MED ORDER — TRIAMCINOLONE ACETONIDE 40 MG/ML IJ SUSP
10.0000 mg | INTRAMUSCULAR | Status: AC | PRN
  Administered 2017-06-06: 10 mg via INTRAMUSCULAR

## 2017-06-06 MED ORDER — LIDOCAINE HCL 1 % IJ SOLN
0.5000 mL | INTRAMUSCULAR | Status: AC | PRN
  Administered 2017-06-06: .5 mL

## 2017-08-31 ENCOUNTER — Other Ambulatory Visit: Payer: Self-pay | Admitting: Rheumatology

## 2017-09-01 NOTE — Telephone Encounter (Signed)
Last Visit: 06/06/17 Next Visit: 12/05/17  Okay to refill per Dr. Corliss Skainseveshwar

## 2017-11-22 NOTE — Progress Notes (Signed)
Office Visit Note  Patient: Jamie Yang             Date of Birth: 01-17-1976           MRN: [id number]             PCP: Maximiano Coss, MD Referring: Maximiano Coss,* Visit Date: 12/05/2017 Occupation: @GUAROCC @    Subjective:  Lower back pain   History of Present Illness: Jamie Yang is a 42 y.o. female with history of fibromyalgia and degenerative disc disease of the lumbar spine.  Patient states that she is having significant lower back pain and sciatica.  She states that after her last visit she went to physical therapy which benefited her at first but she began to not improve after some time.  Her last visit was 1-2 months ago.  She states that this past Wednesday she developed increasing pain in her lower back and right SI joint.  She developed constant shooting pains and numbness down her right leg.   She reports when the pain is most severe it is a cramp-like sensation.  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  She states her pain is worse in the morning and she has significant stiffness first thing in the morning.  She states the pain is worse if she has pressure on the right side of her leg.  She states she has some numbness in her left leg as well but is more sporadic.  She states that she has been having more difficulty sleeping at night due to having to make positional changes.  She states she has been taking Robaxin and gabapentin.  She states she is also been using Bengay, which has not been helping.  She reports that her fibromyalgia has been very well controlled.  She states that some days she has increased generalized pain if she is overdoing it.  She states that her insomnia and fatigue are stable.   Activities of Daily Living:  Patient reports morning stiffness for 1 hour.   Patient Reports nocturnal pain.  Difficulty dressing/grooming: Reports Difficulty climbing  stairs: Reports Difficulty getting out of chair: Denies Difficulty using hands for taps, buttons, cutlery, and/or writing: Reports   Review of Systems  Constitutional: Positive for fatigue.  HENT: Negative for mouth sores, mouth dryness and nose dryness.   Eyes: Positive for dryness. Negative for pain and visual disturbance.  Respiratory: Negative for cough, hemoptysis, shortness of breath and difficulty breathing.   Cardiovascular: Negative for chest pain, palpitations, hypertension and swelling in legs/feet.  Gastrointestinal: Negative for abdominal pain, blood in stool, constipation, diarrhea and nausea.  Endocrine: Negative for increased urination.  Genitourinary: Negative for painful urination and pelvic pain.  Musculoskeletal: Positive for arthralgias, joint pain, joint swelling and morning stiffness. Negative for myalgias, muscle weakness, muscle tenderness and myalgias.  Skin: Negative for color change, pallor, rash, hair loss, nodules/bumps, skin tightness, ulcers and sensitivity to sunlight.  Allergic/Immunologic: Negative for susceptible to infections.  Neurological: Positive for numbness (Right leg ), headaches and weakness (Right leg). Negative for dizziness.  Hematological: Negative for bruising/bleeding tendency and swollen glands.  Psychiatric/Behavioral: Negative for depressed mood, confusion and sleep disturbance. The patient is not nervous/anxious.     PMFS History:  Patient Active Problem List   Diagnosis Date Noted  . Anxiety and depression 06/05/2017  . Other fatigue 09/01/2016  . Vitamin D deficiency 09/01/2016  .  History of migraine 09/01/2016  . History of gastroesophageal reflux (GERD) 09/01/2016  . History of fatty infiltration of liver 09/01/2016  . History of kidney stones 09/01/2016  . History of irritable bowel syndrome 09/01/2016  . Cellulitis 02/11/2013  . Fibromyalgia 02/11/2013  . GERD (gastroesophageal reflux disease) 02/11/2013    Past Medical  History:  Diagnosis Date  . Depression   . Dog bite of left thumb   . Family history of anesthesia complication    "parents very sensitive to RX" (02/12/2013)  . Fibromyalgia   . GERD (gastroesophageal reflux disease)   . Kidney stones    "passed on own" (02/12/2013)  . Migraines    "maybe monthly" (02/12/2013)  . Osteoarthritis of hand    "both" (02/12/2013)  . Pneumonia 1994    Family History  Problem Relation Age of Onset  . Fibromyalgia Mother   . Depression Mother    Past Surgical History:  Procedure Laterality Date  . CESAREAN SECTION    . FINGER NAIL SURGERY  02/11/2013   'took it off to get to I&D" (02/11/2013)  . I&D EXTREMITY Left 02/11/2013   Procedure: IRRIGATION AND DEBRIDEMENTLeft thumb with nail removal.;  Surgeon: Jodi Marbleavid A Thompson, MD;  [facility];  Service: Orthopedics;  Laterality: Left;  . INCISION AND DRAINAGE OF WOUND Left 02/11/2013   "thumb; dogbite" (02/11/2013)   Social History   Social History Narrative  . Not on file     Objective: Vital Signs: BP 118/77 (BP Location: Left Arm, Patient Position: Sitting, Cuff Size: Large)   Pulse 85   Resp 14   Ht 5\' 2"  (1.575 m)   Wt 236 lb (107 kg)   BMI 43.16 kg/m    Physical Exam  Constitutional: She is oriented to person, place, and time. She appears well-developed and well-nourished.  HENT:  Head: Normocephalic and atraumatic.  Eyes: Conjunctivae and EOM are normal.  Neck: Normal range of motion.  Cardiovascular: Normal rate, regular rhythm, normal heart sounds and intact distal pulses.  Pulmonary/Chest: Effort normal and breath sounds normal.  Abdominal: Soft. Bowel sounds are normal.  Lymphadenopathy:    She has no cervical adenopathy.  Neurological: She is alert and oriented to person, place, and time.  Skin: Skin is warm and dry. Capillary refill takes less than 2 seconds.  Psychiatric: She has a normal mood and affect. Her behavior is normal.  Nursing note and vitals reviewed.     Musculoskeletal Exam: C-spine and thoracic spine good range of motion.  She is midline spinal tenderness in the lumbar region and bilateral SI joints.  Shoulder joints, elbow joints, wrist joints, MCPs, PIPs, DIPs good range of motion with no synovitis.  She is some tenderness of her left second MCP and second PIP joint.  Hip joints, knee joints, ankle joints, MTPs, PIPs, DIPs good range of motion with no synovitis.  No warmth or effusion of bilateral knees.  No knee crepitus.  No tenderness of trochanteric bursa.  CDAI Exam: No CDAI exam completed.    Investigation: No additional findings.   Imaging: No results found.  Speciality Comments: No specialty comments available.    Procedures:  No procedures performed Allergies: Patient has no known allergies.   Assessment / Plan:     Visit Diagnoses: Fibromyalgia: Generalized muscle tenderness and muscle tension has been under control recently.  She continues to take Robaxin on an as-needed basis for muscle tension and muscle spasms.  Her insomnia and fatigue are stable.  She would  like to exercise more on a regular basis but she has been having severe pain in her lower back which has been inhibiting her from exercising.  Other fatigue: Chronic   DDD (degenerative disc disease), lumbar: She had an x-ray performed on 06/06/17, which revealed Multilevel spondylosis with anterior spurring and significant narrowing between T12-L1, L1-L2 and L4-L5.  She has midline spinal tenderness in the lumbar region as well as bilateral SI joints.  She is having symptoms of bilateral sciatica, worse on the right. She has tried physical therapy but she stopped progressing and discontinued.  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  She has been taking Robaxin and using Bengay with minimal relief. We will schedule an MRI of her lumbar spine.  A referral will also be placed for further evaluation.     History of vitamin D deficiency: She is no longer taking a vitamin D supplement.   Other medical conditions are listed as follows:   History of obesity  History of kidney stones  History of irritable bowel syndrome  History of fatty infiltration of liver  History of migraine  History of gastroesophageal reflux (GERD)  Anxiety and depression    Orders: Orders Placed This Encounter  Procedures  . Ambulatory referral to Orthopedic Surgery   Meds ordered this encounter  Medications  . lidocaine (LIDODERM) 5 %    Sig: Place 1 patch onto the skin daily. Remove & Discard patch within 12 hours or as directed by MD    Dispense:  30 patch    Refill:  0    Face-to-face time spent with patient was 30 minutes. >50% of time was spent in counseling and coordination of care.  Follow-Up Instructions: Return in about 6 months (around 06/06/2018) for Fibromyalgia,DDD.   Sherron Ales PA-C   I examined and evaluated the patient with Sherron Ales PA.  Patient was having significant discomfort in her lower back.  We discussed different treatment options.  She has tried most of them.  A referral to back specialist was made.  The plan of care was discussed as noted above.  Pollyann Savoy, MD Note - This record has been created using Animal nutritionist.  Chart creation errors have been sought, but may not always  have been located. Such creation errors do not reflect on  the standard of medical care.

## 2017-12-05 ENCOUNTER — Encounter: Payer: Self-pay | Admitting: Rheumatology

## 2017-12-05 ENCOUNTER — Ambulatory Visit (INDEPENDENT_AMBULATORY_CARE_PROVIDER_SITE_OTHER): Payer: BLUE CROSS/BLUE SHIELD | Admitting: Rheumatology

## 2017-12-05 ENCOUNTER — Ambulatory Visit (INDEPENDENT_AMBULATORY_CARE_PROVIDER_SITE_OTHER): Payer: Self-pay

## 2017-12-05 VITALS — BP 118/77 | HR 85 | Resp 14 | Ht 62.0 in | Wt 236.0 lb

## 2017-12-05 DIAGNOSIS — Z8719 Personal history of other diseases of the digestive system: Secondary | ICD-10-CM

## 2017-12-05 DIAGNOSIS — M533 Sacrococcygeal disorders, not elsewhere classified: Secondary | ICD-10-CM | POA: Diagnosis not present

## 2017-12-05 DIAGNOSIS — G8929 Other chronic pain: Secondary | ICD-10-CM

## 2017-12-05 DIAGNOSIS — F329 Major depressive disorder, single episode, unspecified: Secondary | ICD-10-CM | POA: Diagnosis not present

## 2017-12-05 DIAGNOSIS — M5136 Other intervertebral disc degeneration, lumbar region: Secondary | ICD-10-CM

## 2017-12-05 DIAGNOSIS — Z8669 Personal history of other diseases of the nervous system and sense organs: Secondary | ICD-10-CM | POA: Diagnosis not present

## 2017-12-05 DIAGNOSIS — Z8639 Personal history of other endocrine, nutritional and metabolic disease: Secondary | ICD-10-CM | POA: Diagnosis not present

## 2017-12-05 DIAGNOSIS — Z87442 Personal history of urinary calculi: Secondary | ICD-10-CM

## 2017-12-05 DIAGNOSIS — F419 Anxiety disorder, unspecified: Secondary | ICD-10-CM | POA: Diagnosis not present

## 2017-12-05 DIAGNOSIS — M51369 Other intervertebral disc degeneration, lumbar region without mention of lumbar back pain or lower extremity pain: Secondary | ICD-10-CM

## 2017-12-05 DIAGNOSIS — M797 Fibromyalgia: Secondary | ICD-10-CM

## 2017-12-05 DIAGNOSIS — R5383 Other fatigue: Secondary | ICD-10-CM | POA: Diagnosis not present

## 2017-12-05 MED ORDER — LIDOCAINE 5 % EX PTCH: 1.0000 | MEDICATED_PATCH | CUTANEOUS | 0 refills | Status: DC

## 2017-12-28 ENCOUNTER — Ambulatory Visit (INDEPENDENT_AMBULATORY_CARE_PROVIDER_SITE_OTHER): Payer: BLUE CROSS/BLUE SHIELD | Admitting: Specialist

## 2018-01-16 ENCOUNTER — Other Ambulatory Visit (INDEPENDENT_AMBULATORY_CARE_PROVIDER_SITE_OTHER): Payer: Self-pay

## 2018-01-16 ENCOUNTER — Ambulatory Visit (INDEPENDENT_AMBULATORY_CARE_PROVIDER_SITE_OTHER): Payer: BLUE CROSS/BLUE SHIELD | Admitting: Orthopaedic Surgery

## 2018-01-16 VITALS — BP 122/76 | HR 76 | Ht 62.0 in | Wt 236.0 lb

## 2018-01-16 DIAGNOSIS — M545 Low back pain: Secondary | ICD-10-CM

## 2018-01-16 DIAGNOSIS — G8929 Other chronic pain: Secondary | ICD-10-CM

## 2018-01-16 DIAGNOSIS — M4807 Spinal stenosis, lumbosacral region: Secondary | ICD-10-CM

## 2018-01-25 ENCOUNTER — Other Ambulatory Visit: Payer: Self-pay

## 2018-01-29 ENCOUNTER — Encounter (INDEPENDENT_AMBULATORY_CARE_PROVIDER_SITE_OTHER): Payer: Self-pay | Admitting: Orthopaedic Surgery

## 2018-01-29 NOTE — Progress Notes (Signed)
Office Visit Note   Patient: Jamie Yang           Date of Birth: Mar 30, 1976           MRN: [id number] Visit Date: 01/16/2018              Requested by: Gearldine Bienenstock, PA-C [address] [place], [place] [address] PCP: Maximiano Coss, MD   Assessment & Plan: Visit Diagnoses:  1. Chronic bilateral low back pain, with sciatica presence unspecified     Plan: The patient's progressive symptoms we will proceed with lumbar MRI scan to evaluate her for lateral recess stenosis worse on the right than left most likely at the L4-5 level where her plain radiographs disc space narrowing.  Office follow-up after scan.  Follow-Up Instructions: Office follow-up after lumbar MRI scan.  Orders:  No orders of the defined types were placed in this encounter.  No orders of the defined types were placed in this encounter.     Procedures: No procedures performed   Clinical Data: No additional findings.   Subjective: Chief Complaint  Patient presents with  . Lower Back - Pain    HPI 42 year old female with gradual progressive back pain right greater than left buttocks pain that radiates into her thighs present for 5 months.  Gradually progressed over the last several months.  Negative for chills or fever.  She does have fibromyalgia and chronic fatigue.  No associated bowel or bladder symptoms.  Review of Systems 14 point review of systems positive for fibromyalgia GERD, vitamin D deficiency, history of migraines, history of fatty infiltrate of the liver.  Previous kidney stones.  3 of IBS.  Anxiety and depression.  Positive for joint aching and joint stiffness.  Otherwise negative as it pertains HPI.   Objective: Vital Signs: BP 122/76   Pulse 76   Ht  (1.575 m)   Wt 236 lb (107 kg)   BMI 43.16 kg/m   Physical Exam  Constitutional: She is oriented to person, place, and time. She appears well-developed.  HENT:  Head: Normocephalic.  Right Ear: External ear  normal.  Left Ear: External ear normal.  Eyes: Pupils are equal, round, and reactive to light.  Neck: No tracheal deviation present. No thyromegaly present.  Cardiovascular: Normal rate.  Pulmonary/Chest: Effort normal.  Abdominal: Soft.  Neurological: She is alert and oriented to person, place, and time.  Skin: Skin is warm and dry.  Psychiatric: She has a normal mood and affect. Her behavior is normal.    Ortho Exam patient has bilateral trapezial tenderness.  Upper and lower extremity reflexes are 2+ and symmetrical.  Leg raising at 80 degrees on the right negative on the left.  Knee and ankle jerk intact.  Anterior tib gastrocsoleus are intact toe walk.  Specialty Comments:  No specialty comments available.  Imaging: Previous lumbar x-ray  imaging showed spurring at T12-L1 with narrowing.  Narrowing at L1-2 and at L4-5.  PMFS History: Patient Active Problem List   Diagnosis Date Noted  . Anxiety and depression 06/05/2017  . Other fatigue 09/01/2016  . Vitamin D deficiency 09/01/2016  . History of migraine 09/01/2016  . History of gastroesophageal reflux (GERD) 09/01/2016  . History of fatty infiltration of liver 09/01/2016  . History of kidney stones 09/01/2016  . History of irritable bowel syndrome 09/01/2016  . Cellulitis 02/11/2013  . Fibromyalgia 02/11/2013  . GERD (gastroesophageal reflux disease) 02/11/2013   Past Medical History:  Diagnosis Date  .  Depression   . Dog bite of left thumb   . Family history of anesthesia complication    "parents very sensitive to RX" (02/26/13)  . Fibromyalgia   . GERD (gastroesophageal reflux disease)   . Kidney stones    "passed on own" (02/26/13)  . Migraines    "maybe monthly" (2013-02-26)  . Osteoarthritis of hand    "both" (02/26/2013)  . Pneumonia 1994    Family History  Problem Relation Age of Onset  . Fibromyalgia Mother   . Depression Mother     Past Surgical History:  Procedure Laterality Date  . CESAREAN  SECTION    . FINGER NAIL SURGERY  02/11/2013   'took it off to get to I&D" (02/11/2013)  . I&D EXTREMITY Left 02/11/2013   Procedure: IRRIGATION AND DEBRIDEMENTLeft thumb with nail removal.;  Surgeon: Jodi Marble, MD;  Location: [facility] OR;  Service: Orthopedics;  Laterality: Left;  . INCISION AND DRAINAGE OF WOUND Left 02/11/2013   "thumb; dogbite" (02/11/2013)   Social History   Occupational History  . Not on file  Tobacco Use  . Smoking status: Former Smoker    Packs/day: 0.50    Years: 15.00    Pack years: 7.50    Types: Cigarettes    Last attempt to quit: 09/06/2009    Years since quitting: 8.4  . Smokeless tobacco: Never Used  Substance and Sexual Activity  . Alcohol use: No  . Drug use: No  . Sexual activity: Yes

## 2018-02-06 ENCOUNTER — Ambulatory Visit (INDEPENDENT_AMBULATORY_CARE_PROVIDER_SITE_OTHER): Payer: BLUE CROSS/BLUE SHIELD | Admitting: Orthopaedic Surgery

## 2018-02-13 ENCOUNTER — Ambulatory Visit (INDEPENDENT_AMBULATORY_CARE_PROVIDER_SITE_OTHER): Payer: BLUE CROSS/BLUE SHIELD | Admitting: Orthopaedic Surgery

## 2018-02-13 ENCOUNTER — Ambulatory Visit
Admission: RE | Admit: 2018-02-13 | Discharge: 2018-02-13 | Disposition: A | Discharge status: Home / Self Care | Payer: BLUE CROSS/BLUE SHIELD | Source: Ambulatory Visit | Attending: Orthopaedic Surgery | Admitting: Orthopaedic Surgery

## 2018-02-13 DIAGNOSIS — M4807 Spinal stenosis, lumbosacral region: Secondary | ICD-10-CM

## 2018-02-20 ENCOUNTER — Ambulatory Visit (INDEPENDENT_AMBULATORY_CARE_PROVIDER_SITE_OTHER): Payer: BLUE CROSS/BLUE SHIELD | Admitting: Orthopaedic Surgery

## 2018-02-20 ENCOUNTER — Encounter (INDEPENDENT_AMBULATORY_CARE_PROVIDER_SITE_OTHER): Payer: Self-pay | Admitting: Orthopaedic Surgery

## 2018-02-20 VITALS — BP 126/83 | HR 87 | Ht 62.0 in | Wt 230.0 lb

## 2018-02-20 DIAGNOSIS — M5126 Other intervertebral disc displacement, lumbar region: Secondary | ICD-10-CM

## 2018-02-20 DIAGNOSIS — M5136 Other intervertebral disc degeneration, lumbar region: Secondary | ICD-10-CM

## 2018-02-20 NOTE — Progress Notes (Signed)
Office Visit Note   Patient: Jamie Yang           Date of Birth: 07-Aug-1976           MRN: [id number] Visit Date: 02/20/2018              Requested by: Maximiano Coss, MD [facility] [address] [place], [place] [address] PCP: Maximiano Coss, MD   Assessment & Plan: Visit Diagnoses:  1. Bulge of lumbar disc without myelopathy     Plan: Patient is shallow disc bulge at L4-5 with disc desiccation.  No central or foraminal compression.  Minimal bulge at L5-S1 with bilateral pars defects at L5 with only trace anterolisthesis.  Patient is neurologically intact.  We discussed continued weight loss going back to exercising with her husband that she done previously to work on core strengthening and increase activities which should also help her overall health as well as depression.  If she has increased symptoms she can return.  Copy of MRI given and we reviewed the images.  Follow-Up Instructions: Return if symptoms worsen or fail to improve.   Orders:  No orders of the defined types were placed in this encounter.  No orders of the defined types were placed in this encounter.     Procedures: No procedures performed   Clinical Data: No additional findings.   Subjective: Chief Complaint  Patient presents with  . Lower Back - Follow-up, Pain    HPI 42 year old female returns with ongoing problems with her back which is chronic.  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  She is taken muscle relaxants in the past for fibromyalgia as well as over-the-counter anti-inflammatories.  She is gone through hormonal treatments attempts to get pregnant has had miscarriages and then associated weight gain.  She admits some depression problems associated with this.  She had lost some weight and then regained some weight.  Denies associated bowel or bladder symptoms.  She denies fever  or chills.  MRI scan is available for review.  Review of Systems 14 point systems updated unchanged from 01/16/2018 visit other than as mentioned above.   Objective: Vital Signs: BP 126/83   Pulse 87   Ht 5\' 2"  (1.575 m)   Wt 230 lb (104.3 kg)   BMI 42.07 kg/m   Physical Exam  Constitutional: She is oriented to person, place, and time. She appears well-developed.  HENT:  Head: Normocephalic.  Right Ear: External ear normal.  Left Ear: External ear normal.  Eyes: Pupils are equal, round, and reactive to light.  Neck: No tracheal deviation present. No thyromegaly present.  Cardiovascular: Normal rate.  Pulmonary/Chest: Effort normal.  Abdominal: Soft.  Neurological: She is alert and oriented to person, place, and time.  Skin: Skin is warm and dry.  Psychiatric: She has a normal mood and affect. Her behavior is normal.    Ortho Exam patient has good hip range of motion no rash over exposed skin.  Increased truncal BMI.  Knees reach full extension negative popliteal compression test.  No rash over exposed skin.  Reflexes are intact anterior tib EHL is intact she is able heel and toe walk. Specialty Comments:  No specialty comments available.  Imaging: CLINICAL DATA:  Low back pain since the patient bent over and felt a pop in the back 6 months ago. Buttock and bilateral foot pain.  EXAM: MRI LUMBAR SPINE WITHOUT CONTRAST  TECHNIQUE: Multiplanar, multisequence MR imaging of the lumbar spine was performed. No intravenous contrast was administered.  COMPARISON:  Two views lumbar spine 06/06/2017 [facility].  FINDINGS: Segmentation: Hypoplastic ribs off the T12 vertebral body are noted. On this examination, the last fully open disc space is labeled L5-S1.  Alignment: Trace anterolisthesis L5 on S1 and 0.2 cm retrolisthesis L4 on L5 is identified.  Vertebrae: No fracture or worrisome lesion. 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  Small Schmorl's nodes in the upper thoracic and lower lumbar spine are noted. Hemangioma in L2 also noted.  Conus medullaris and cauda equina: Conus extends to the L1 level. Conus and cauda equina appear normal.  Paraspinal and other soft tissues: Negative.  Disc levels:  T11-12 is imaged in the sagittal plane only and negative.  T12-L1: Shallow left paracentral protrusion without stenosis.  L1-2: Minimal disc bulge without stenosis.  L2-3: Negative.  L3-4: Negative.  L4-5: Very shallow disc bulge. The central canal and foramina are widely patent.  L5-S1: Minimal disc bulge. The central canal and foramina are widely patent.  IMPRESSION: The central spinal canal and neural foramina are widely patent at all levels with mild degenerative disc disease seen at L4-5 and L5-S1.  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   Electronically Signed   By: Drusilla Kannerhomas  Dalessio M.D.   On: 02/13/2018 12:53    PMFS History: Patient Active Problem List   Diagnosis Date Noted  . Anxiety and depression 06/05/2017  . Other fatigue 09/01/2016  . Vitamin D deficiency 09/01/2016  . History of migraine 09/01/2016  . History of gastroesophageal reflux (GERD) 09/01/2016  . History of fatty infiltration of liver 09/01/2016  . History of kidney stones 09/01/2016  . History of irritable bowel syndrome 09/01/2016  . Cellulitis 02/11/2013  . Fibromyalgia 02/11/2013  . GERD (gastroesophageal reflux disease) 02/11/2013   Past Medical History:  Diagnosis Date  . Depression   . Dog bite of left thumb   . Family history of anesthesia complication    "parents very sensitive to RX" (02/12/2013)  . Fibromyalgia   . GERD (gastroesophageal reflux disease)   . Kidney stones    "passed on own" (02/12/2013)  . Migraines    "maybe monthly" (02/12/2013)  . Osteoarthritis of hand    "both" (02/12/2013)  . Pneumonia 1994    Family History    Problem Relation Age of Onset  . Fibromyalgia Mother   . Depression Mother     Past Surgical History:  Procedure Laterality Date  . CESAREAN SECTION    . FINGER NAIL SURGERY  02/11/2013   'took it off to get to I&D" (02/11/2013)  . I&D EXTREMITY Left 02/11/2013   Procedure: IRRIGATION AND DEBRIDEMENTLeft thumb with nail removal.;  Surgeon: Jodi Marbleavid A Thompson, MD;  [facility];  Service: Orthopedics;  Laterality: Left;  . INCISION AND DRAINAGE OF WOUND Left 02/11/2013   "thumb; dogbite" (02/11/2013)   Social History   Occupational History  . Not on file  Tobacco Use  . Smoking status: Former Smoker    Packs/day: 0.50    Years: 15.00    Pack years: 7.50    Types: Cigarettes    Last attempt to quit: 09/06/2009    Years since quitting: 8.4  . Smokeless tobacco: Never Used  Substance and Sexual Activity  . Alcohol use: No  . Drug use: No  . Sexual activity: Yes

## 2018-05-23 NOTE — Progress Notes (Signed)
Office Visit Note  Patient: Jamie Yang             Date of Birth: 08/08/1976           MRN: [id number]             PCP: Maximiano CossHungarland, John David, MD Referring: Maximiano CossHungarland, John David,* Visit Date: 06/06/2018 Occupation: @GUAROCC @  Subjective:  Lower back pain.   History of Present Illness: Jamie Yang is a 42 y.o. female with history of fibromyalgia and degenerative disc disease.  She continues to have some lower and.  Sometimes the pain is more muscular and sometimes in the spine area.  She also continues to have some generalized pain from fibromyalgia.  She states her fibromyalgia symptoms flare if she takes sumatriptan for her migraines.  She has been taking Flexeril at bedtime due to muscle spasms and insomnia.  Robaxin is used only on PRN basis.  Activities of Daily Living:  Patient reports morning stiffness for 10 minutes.   Patient Denies nocturnal pain.  Difficulty dressing/grooming: Denies Difficulty climbing stairs: Denies Difficulty getting out of chair: Denies Difficulty using hands for taps, buttons, cutlery, and/or writing: Denies  Review of Systems  Constitutional: Negative for fatigue, night sweats, weight gain and weight loss.  HENT: Negative for mouth sores, trouble swallowing, trouble swallowing, mouth dryness and nose dryness.   Eyes: Negative for pain, redness, visual disturbance and dryness.  Respiratory: Negative for cough, shortness of breath and difficulty breathing.   Cardiovascular: Negative for chest pain, palpitations, hypertension, irregular heartbeat and swelling in legs/feet.  Gastrointestinal: Negative for blood in stool, constipation and diarrhea.  Endocrine: Negative for increased urination.  Genitourinary: Negative for vaginal dryness.  Musculoskeletal: Positive for arthralgias, joint pain, myalgias, morning stiffness and myalgias. Negative for joint swelling, muscle weakness and muscle tenderness.  Skin: Negative for color change, rash, hair  loss, skin tightness, ulcers and sensitivity to sunlight.  Allergic/Immunologic: Negative for susceptible to infections.  Neurological: Negative for dizziness, memory loss, night sweats and weakness.  Hematological: Negative for swollen glands.  Psychiatric/Behavioral: Positive for sleep disturbance. Negative for depressed mood. The patient is not nervous/anxious.     PMFS History:  Patient Active Problem List   Diagnosis Date Noted  . Anxiety and depression 06/05/2017  . Other fatigue 09/01/2016  . Vitamin D deficiency 09/01/2016  . History of migraine 09/01/2016  . History of gastroesophageal reflux (GERD) 09/01/2016  . History of fatty infiltration of liver 09/01/2016  . History of kidney stones 09/01/2016  . History of irritable bowel syndrome 09/01/2016  . Cellulitis 02/11/2013  . Fibromyalgia 02/11/2013  . GERD (gastroesophageal reflux disease) 02/11/2013    Past Medical History:  Diagnosis Date  . Depression   . Dog bite of left thumb   . Family history of anesthesia complication    "parents very sensitive to RX" (02/12/2013)  . Fibromyalgia   . GERD (gastroesophageal reflux disease)   . Kidney stones    "passed on own" (02/12/2013)  . Migraines    "maybe monthly" (02/12/2013)  . Osteoarthritis of hand    "both" (02/12/2013)  . Pneumonia 1994    Family History  Problem Relation Age of Onset  . Fibromyalgia Mother   . Depression Mother   . Breast cancer Mother   . Esophageal cancer Maternal Grandfather   . Hemachromatosis Paternal Grandfather    Past Surgical History:  Procedure Laterality Date  . CESAREAN SECTION  2017  . FINGER NAIL SURGERY  02/11/2013   'took  it off to get to I&D" (02/11/2013)  . I&D EXTREMITY Left 02/11/2013   Procedure: IRRIGATION AND DEBRIDEMENTLeft thumb with nail removal.;  Surgeon: Jodi Marble, MD;  Location: [facility] OR;  Service: Orthopedics;  Laterality: Left;  . INCISION AND DRAINAGE OF WOUND Left 02/11/2013   "thumb; dogbite" (02/11/2013)    Social History   Social History Narrative  . Not on file    Objective: Vital Signs: BP 114/78 (BP Location: Left Arm, Patient Position: Sitting, Cuff Size: Large)   Pulse 85   Resp 16   Ht 5\' 2"  (1.575 m)   Wt 235 lb (106.6 kg)   BMI 42.98 kg/m    Physical Exam  Constitutional: She is oriented to person, place, and time. She appears well-developed and well-nourished.  HENT:  Head: Normocephalic and atraumatic.  Eyes: Conjunctivae and EOM are normal.  Neck: Normal range of motion.  Cardiovascular: Normal rate, regular rhythm, normal heart sounds and intact distal pulses.  Pulmonary/Chest: Effort normal and breath sounds normal.  Abdominal: Soft. Bowel sounds are normal.  Lymphadenopathy:    She has no cervical adenopathy.  Neurological: She is alert and oriented to person, place, and time.  Skin: Skin is warm and dry. Capillary refill takes less than 2 seconds.  Psychiatric: She has a normal mood and affect. Her behavior is normal.  Nursing note and vitals reviewed.    Musculoskeletal Exam: C-spine thoracic lumbar spine good range of motion.  Shoulder joints elbow joints wrist joint MCPs PIPs DIPs been good range of motion.  Hip joints knee joints ankles MTPs PIPs DIPs in good range of motion.  She has positive tender points and some generalized hyperalgesia.  CDAI Exam: CDAI Score: Not documented Patient Global Assessment: Not documented; Provider Global Assessment: Not documented Swollen: Not documented; Tender: Not documented Joint Exam   Not documented   There is currently no information documented on the homunculus. Go to the Rheumatology activity and complete the homunculus joint exam.  Investigation: No additional findings.  Imaging: No results found.  Recent Labs: Lab Results  Component Value Date   WBC 7.6 09/06/2016   HGB 14.2 09/06/2016   PLT 269 09/06/2016   NA 137 09/06/2016   K 4.2 09/06/2016   CL 103 09/06/2016   CO2 26 09/06/2016    GLUCOSE 85 09/06/2016   BUN 8 09/06/2016   CREATININE 0.88 09/06/2016   BILITOT 0.3 09/06/2016   ALKPHOS 85 09/06/2016   AST 24 09/06/2016   ALT 27 09/06/2016   PROT 7.1 09/06/2016   ALBUMIN 4.2 09/06/2016   CALCIUM 9.5 09/06/2016   GFRAA >89 09/06/2016    Speciality Comments: No specialty comments available.  Procedures:  No procedures performed Allergies: Patient has no known allergies.   Assessment / Plan:     Visit Diagnoses: Fibromyalgia-she continues to have some generalized pain and discomfort.  Although the pain is manageable with current regimen.  She states she has been using Flexeril at bedtime and Robaxin on PRN basis.  Need for regular exercise were discussed.  Other fatigue-fatigue is related to insomnia.  She states that she sleeps well and the fatigue is improved.  DDD (degenerative disc disease), lumbar - x-ray performed on 06/06/17, which revealed Multilevel spondylosis with anterior spurring and significant narrowing between T12-L1, L1-L2 and L4-L5.  She has lower back pain off and on.  Lumbar spine and core muscle strength exercises were discussed.  History of vitamin D deficiency-she is not taking vitamin D supplement now.  Anxiety  and depression-she is on Wellbutrin and Zoloft by PCP.  History of irritable bowel syndrome-currently not very active.  Other medical problems are listed as follows:  History of kidney stones  History of migraine  History of fatty infiltration of liver  History of gastroesophageal reflux (GERD)  History of obesity   Orders: No orders of the defined types were placed in this encounter.  No orders of the defined types were placed in this encounter.   Face-to-face time spent with patient was 30 minutes. Greater than 50% of time was spent in counseling and coordination of care.  Follow-Up Instructions: Return in about 6 months (around 12/06/2018) for FMS DDD.   Pollyann Savoy, MD  Note - This record has been created  using Animal nutrition[employer].  Chart creation errors have been sought, but may not always  have been located. Such creation errors do not reflect on  the standard of medical care.

## 2018-06-06 ENCOUNTER — Ambulatory Visit: Payer: BLUE CROSS/BLUE SHIELD | Admitting: Rheumatology

## 2018-06-06 ENCOUNTER — Encounter: Payer: Self-pay | Admitting: Rheumatology

## 2018-06-06 VITALS — BP 114/78 | HR 85 | Resp 16 | Ht 62.0 in | Wt 235.0 lb

## 2018-06-06 DIAGNOSIS — M5136 Other intervertebral disc degeneration, lumbar region: Secondary | ICD-10-CM

## 2018-06-06 DIAGNOSIS — R5383 Other fatigue: Secondary | ICD-10-CM

## 2018-06-06 DIAGNOSIS — F329 Major depressive disorder, single episode, unspecified: Secondary | ICD-10-CM

## 2018-06-06 DIAGNOSIS — Z8639 Personal history of other endocrine, nutritional and metabolic disease: Secondary | ICD-10-CM | POA: Diagnosis not present

## 2018-06-06 DIAGNOSIS — Z87442 Personal history of urinary calculi: Secondary | ICD-10-CM

## 2018-06-06 DIAGNOSIS — F419 Anxiety disorder, unspecified: Secondary | ICD-10-CM

## 2018-06-06 DIAGNOSIS — Z8669 Personal history of other diseases of the nervous system and sense organs: Secondary | ICD-10-CM

## 2018-06-06 DIAGNOSIS — M797 Fibromyalgia: Secondary | ICD-10-CM

## 2018-06-06 DIAGNOSIS — Z8719 Personal history of other diseases of the digestive system: Secondary | ICD-10-CM

## 2018-06-23 ENCOUNTER — Other Ambulatory Visit: Payer: Self-pay | Admitting: Rheumatology

## 2018-06-25 NOTE — Telephone Encounter (Signed)
Last Visit: 06/06/18 Next visit: 12/05/18  Okay to refill per Dr. Corliss Skains

## 2018-08-23 IMAGING — MR MR LUMBAR SPINE W/O CM
5 series · 47 of 48 positions shown · non-contrast
Comparison: Two views lumbar spine 06/06/2017 from [REDACTED].

CLINICAL DATA: Low back pain since the patient bent over and felt a
pop in the back 6 months ago. Buttock and bilateral foot pain.

EXAM:
MRI LUMBAR SPINE WITHOUT CONTRAST
TECHNIQUE: Multiplanar, multisequence MR imaging of the lumbar spine was
performed. No intravenous contrast was administered.

[Series 3: tirm sag · sagittal · 4.0mm · 0.55mm/px · 5 of 13 slices shown]
[im 1/13]
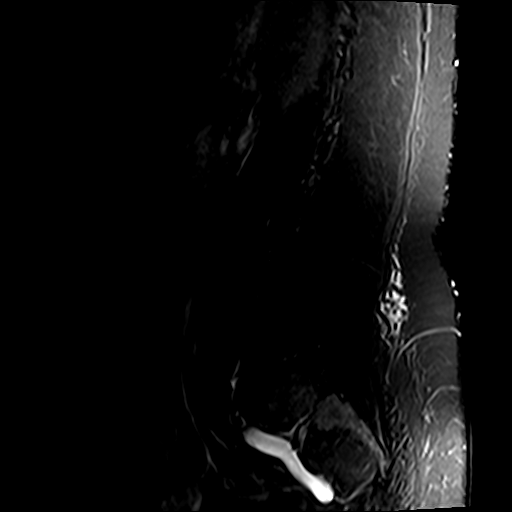
[im 4/13]
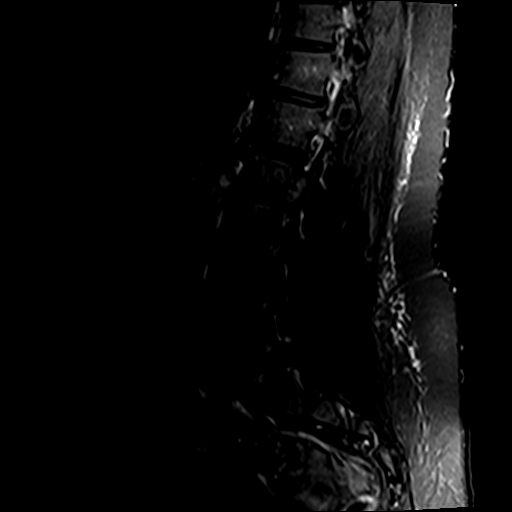
[im 7/13]
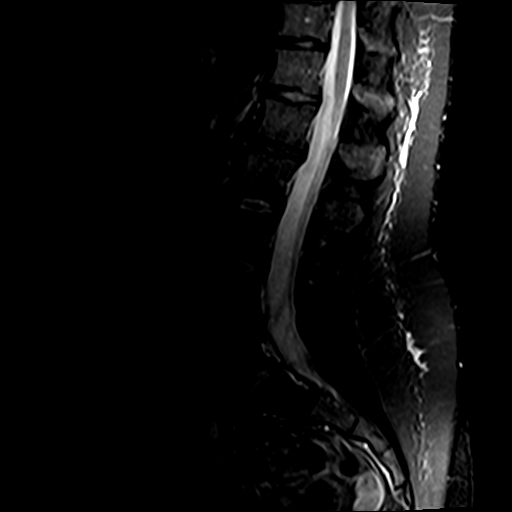
[im 10/13]
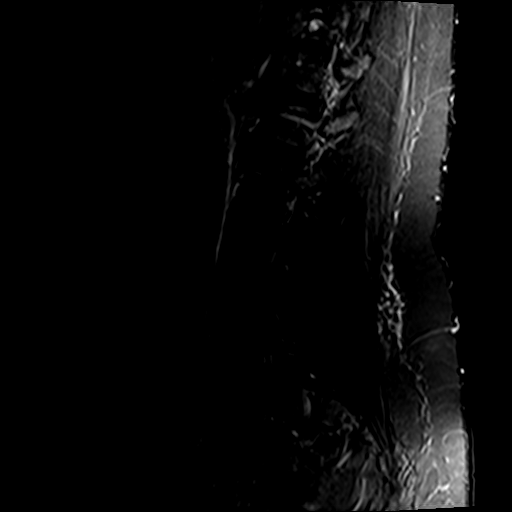
[im 13/13]
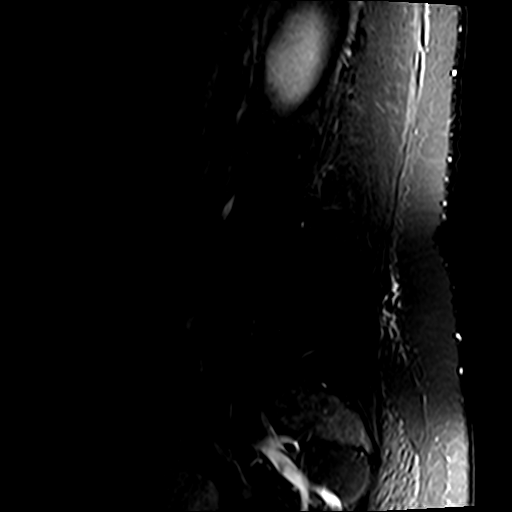

[Series 4: T2 · sagittal · 4.0mm · 0.88mm/px · 5 of 13 slices shown (1 of 2)]
[im 1/13]
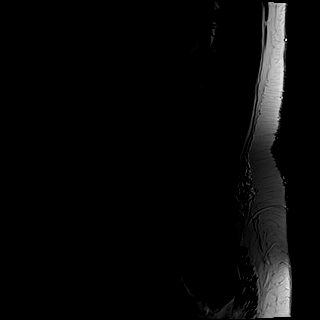
[im 4/13]
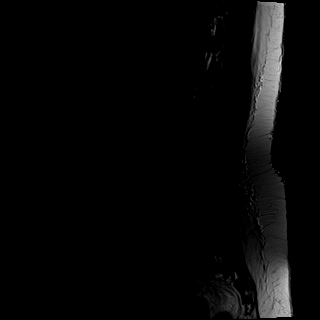
[im 7/13]
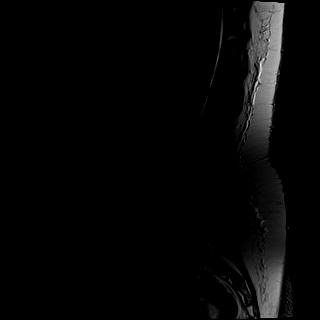
[im 10/13]
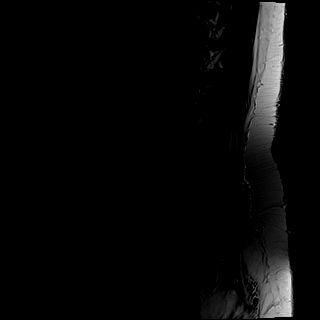
[im 13/13]
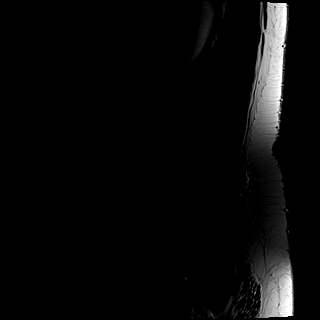

[Series 5: T1 · sagittal · 4.0mm · 0.88mm/px · 6 of 13 slices shown (1 of 2)]
[im 1/13]
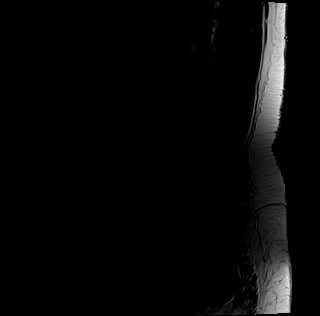
[im 3/13]
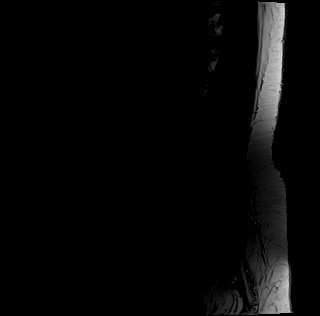
[im 5/13]
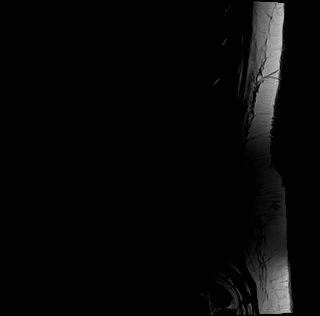
[im 8/13]
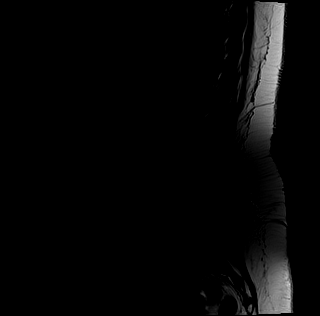
[im 10/13]
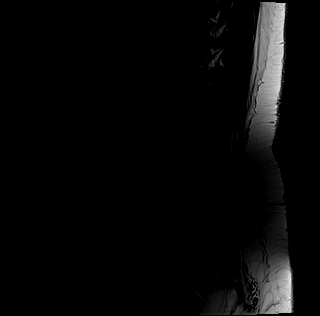
[im 13/13]
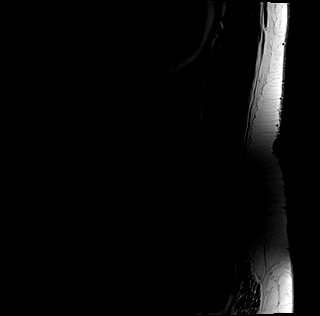

[Series 6: T1 · axial · 4.0mm · 0.78mm/px · z∈[+11,+218]mm · 15 of 37 slices shown (2 of 2)]
[im 1/37]
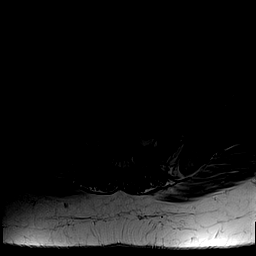
[im 3/37]
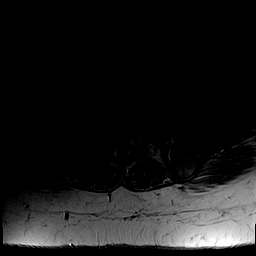
[im 5/37]
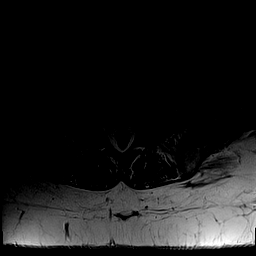
[im 8/37]
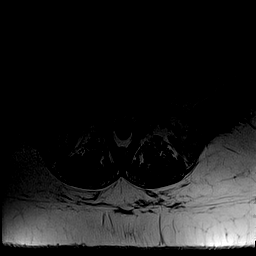
[im 10/37]
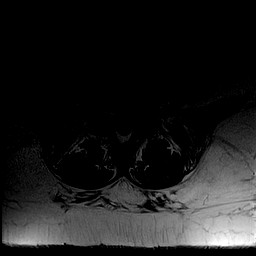
[im 13/37]
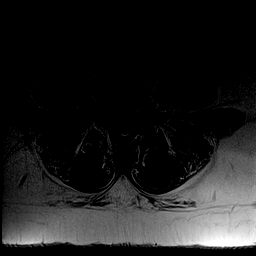
[im 15/37]
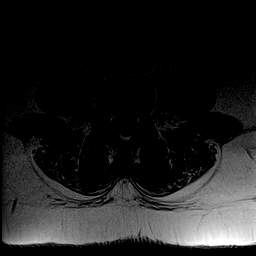
[im 17/37]
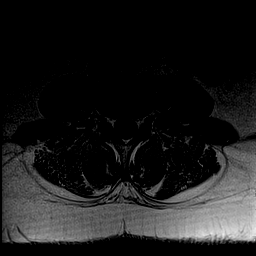
[im 20/37]
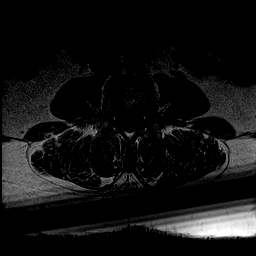
[im 22/37]
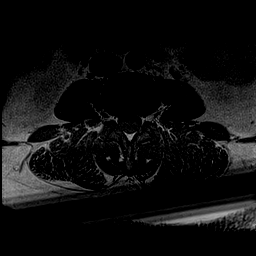
[im 25/37]
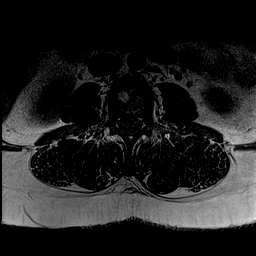
[im 27/37]
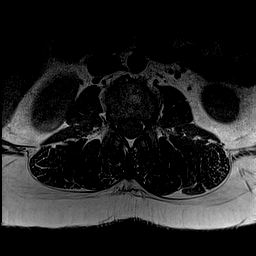
[im 29/37]
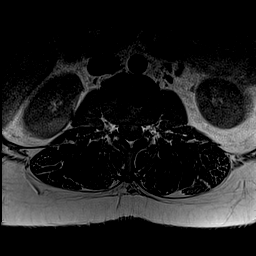
[im 32/37]
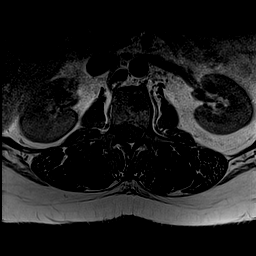
[im 37/37]
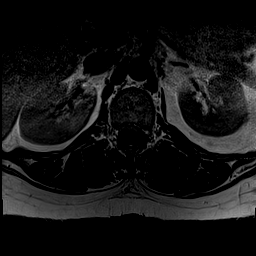

[Series 7: T2 · axial · 4.0mm · 0.78mm/px · z∈[+11,+218]mm · 16 of 37 slices shown (2 of 2)]
[im 1/37]
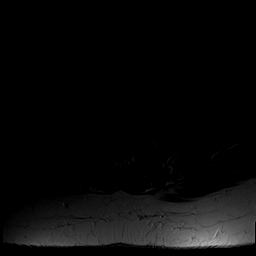
[im 3/37]
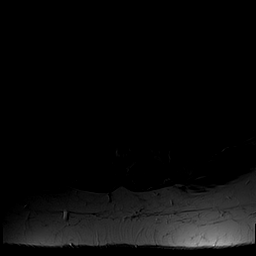
[im 5/37]
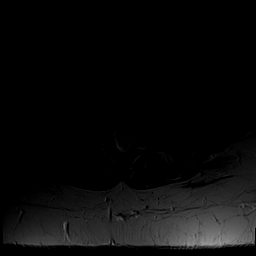
[im 8/37]
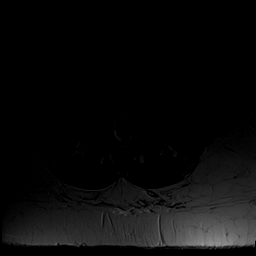
[im 10/37]
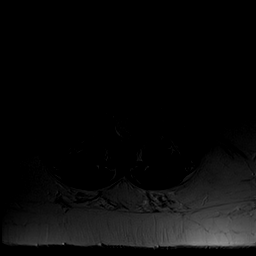
[im 13/37]
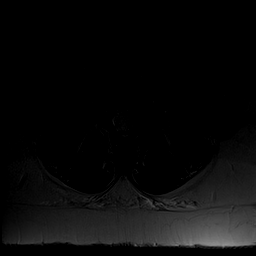
[im 15/37]
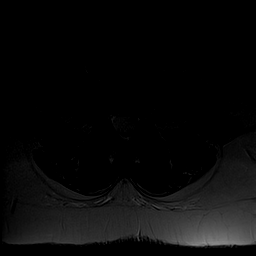
[im 17/37]
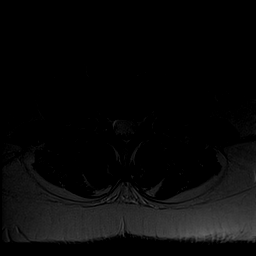
[im 20/37]
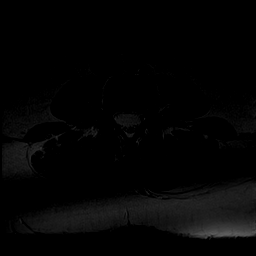
[im 22/37]
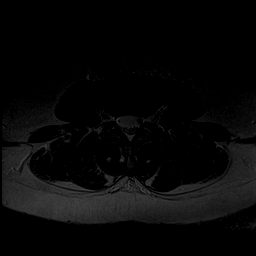
[im 25/37]
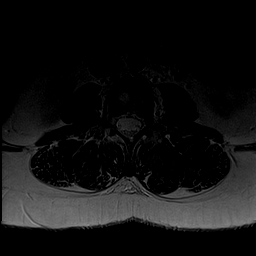
[im 27/37]
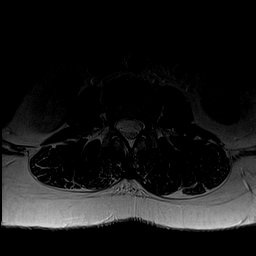
[im 29/37]
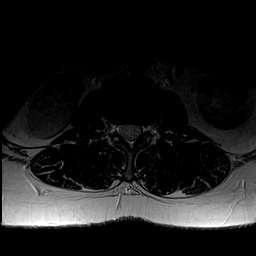
[im 32/37]
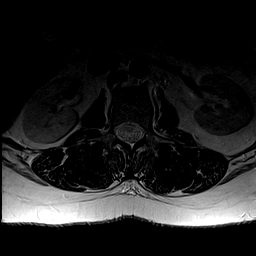
[im 34/37]
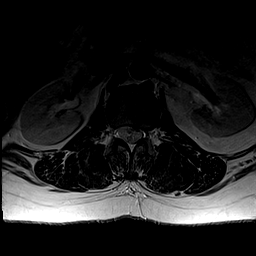
[im 37/37]
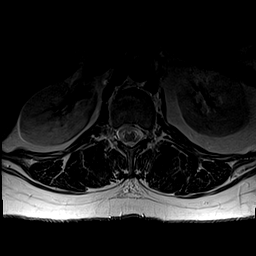

[47 of 48 positions shown; findings below may reference images not displayed]

FINDINGS: Segmentation: Hypoplastic ribs off the T12 vertebral body are noted.
On this examination, the last fully open disc space is labeled
L5-S1.

Alignment: Trace anterolisthesis L5 on S1 and 0.2 cm retrolisthesis
L4 on L5 is identified.

Vertebrae: No fracture or worrisome lesion. Bilateral L5 pars
interarticularis defects are identified. Small Schmorl's nodes in
the upper thoracic and lower lumbar spine are noted. Hemangioma in
L2 also noted.

Conus medullaris and cauda equina: Conus extends to the L1 level.
Conus and cauda equina appear normal.

Paraspinal and other soft tissues: Negative.

Disc levels:

T11-12 is imaged in the sagittal plane only and negative.

T12-L1: Shallow left paracentral protrusion without stenosis.

L1-2: Minimal disc bulge without stenosis.

L2-3: Negative.

L3-4: Negative.

L4-5: Very shallow disc bulge. The central canal and foramina are
widely patent.

L5-S1: Minimal disc bulge. The central canal and foramina are widely
patent.
IMPRESSION: The central spinal canal and neural foramina are widely patent at
all levels with mild degenerative disc disease seen at L4-5 and
L5-S1.

Bilateral L5 pars interarticularis defects result in trace
anterolisthesis L5 on S1.

## 2018-12-05 ENCOUNTER — Ambulatory Visit: Payer: BLUE CROSS/BLUE SHIELD | Admitting: Physician Assistant

## 2019-07-18 ENCOUNTER — Other Ambulatory Visit: Payer: Self-pay | Admitting: Rheumatology
